# Patient Record
Sex: Male | Born: 1935 | Race: White | Hispanic: No | Marital: Married | Smoking: Former smoker
Health system: Southern US, Community
[De-identification: ages and names within clinical notes are randomized; demographics above are authoritative.]

## PROBLEM LIST (undated history)

## (undated) DIAGNOSIS — R0602 Shortness of breath: Secondary | ICD-10-CM

## (undated) DIAGNOSIS — Z860100 Personal history of colon polyps, unspecified: Secondary | ICD-10-CM

## (undated) DIAGNOSIS — B019 Varicella without complication: Secondary | ICD-10-CM

## (undated) DIAGNOSIS — I10 Essential (primary) hypertension: Secondary | ICD-10-CM

## (undated) DIAGNOSIS — C3490 Malignant neoplasm of unspecified part of unspecified bronchus or lung: Secondary | ICD-10-CM

## (undated) DIAGNOSIS — Z8709 Personal history of other diseases of the respiratory system: Secondary | ICD-10-CM

## (undated) HISTORY — PX: TONSILLECTOMY: SUR1361

## (undated) HISTORY — PX: OTHER SURGICAL HISTORY: SHX169

## (undated) HISTORY — DX: Varicella without complication: B01.9

## (undated) HISTORY — PX: UMBILICAL HERNIA REPAIR: SHX196

## (undated) HISTORY — PX: CIRCUMCISION: SUR203

## (undated) HISTORY — DX: Malignant neoplasm of unspecified part of unspecified bronchus or lung: C34.90

## (undated) HISTORY — PX: COLONOSCOPY: SHX174

## (undated) HISTORY — PX: WISDOM TOOTH EXTRACTION: SHX21

---

## 2013-10-02 ENCOUNTER — Other Ambulatory Visit (HOSPITAL_BASED_OUTPATIENT_CLINIC_OR_DEPARTMENT_OTHER): Payer: Self-pay | Admitting: Physician Assistant

## 2013-10-02 DIAGNOSIS — R9389 Abnormal findings on diagnostic imaging of other specified body structures: Secondary | ICD-10-CM

## 2013-10-03 ENCOUNTER — Ambulatory Visit (HOSPITAL_BASED_OUTPATIENT_CLINIC_OR_DEPARTMENT_OTHER)
Admission: RE | Admit: 2013-10-03 | Discharge: 2013-10-03 | Disposition: A | Discharge status: Home / Self Care | Payer: Medicare Other | Source: Ambulatory Visit | Attending: Physician Assistant | Admitting: Physician Assistant

## 2013-10-03 DIAGNOSIS — I2584 Coronary atherosclerosis due to calcified coronary lesion: Secondary | ICD-10-CM | POA: Insufficient documentation

## 2013-10-03 DIAGNOSIS — R05 Cough: Secondary | ICD-10-CM | POA: Insufficient documentation

## 2013-10-03 DIAGNOSIS — R5383 Other fatigue: Secondary | ICD-10-CM

## 2013-10-03 DIAGNOSIS — R911 Solitary pulmonary nodule: Secondary | ICD-10-CM | POA: Insufficient documentation

## 2013-10-03 DIAGNOSIS — R5381 Other malaise: Secondary | ICD-10-CM | POA: Insufficient documentation

## 2013-10-03 DIAGNOSIS — R059 Cough, unspecified: Secondary | ICD-10-CM | POA: Insufficient documentation

## 2013-10-03 DIAGNOSIS — Z87891 Personal history of nicotine dependence: Secondary | ICD-10-CM | POA: Insufficient documentation

## 2013-10-03 DIAGNOSIS — R9389 Abnormal findings on diagnostic imaging of other specified body structures: Secondary | ICD-10-CM

## 2013-10-11 ENCOUNTER — Emergency Department (HOSPITAL_BASED_OUTPATIENT_CLINIC_OR_DEPARTMENT_OTHER)
Admission: EM | Admit: 2013-10-11 | Discharge: 2013-10-11 | Disposition: A | Discharge status: Home / Self Care | Payer: Medicare Other | Attending: Emergency Medicine | Admitting: Emergency Medicine

## 2013-10-11 ENCOUNTER — Emergency Department (HOSPITAL_BASED_OUTPATIENT_CLINIC_OR_DEPARTMENT_OTHER): Payer: Medicare Other

## 2013-10-11 ENCOUNTER — Encounter (HOSPITAL_BASED_OUTPATIENT_CLINIC_OR_DEPARTMENT_OTHER): Payer: Self-pay | Admitting: Emergency Medicine

## 2013-10-11 DIAGNOSIS — J40 Bronchitis, not specified as acute or chronic: Secondary | ICD-10-CM

## 2013-10-11 DIAGNOSIS — J209 Acute bronchitis, unspecified: Secondary | ICD-10-CM | POA: Insufficient documentation

## 2013-10-11 DIAGNOSIS — Z7982 Long term (current) use of aspirin: Secondary | ICD-10-CM | POA: Insufficient documentation

## 2013-10-11 DIAGNOSIS — Z79899 Other long term (current) drug therapy: Secondary | ICD-10-CM | POA: Insufficient documentation

## 2013-10-11 DIAGNOSIS — Z91040 Latex allergy status: Secondary | ICD-10-CM | POA: Insufficient documentation

## 2013-10-11 DIAGNOSIS — I10 Essential (primary) hypertension: Secondary | ICD-10-CM | POA: Insufficient documentation

## 2013-10-11 HISTORY — DX: Essential (primary) hypertension: I10

## 2013-10-11 MED ORDER — IPRATROPIUM BROMIDE 0.02 % IN SOLN
0.5000 mg | Freq: Once | RESPIRATORY_TRACT | Status: AC
  Administered 2013-10-11: 0.5 mg via RESPIRATORY_TRACT
  Filled 2013-10-11: qty 2.5

## 2013-10-11 MED ORDER — PREDNISONE 50 MG PO TABS: 50.0000 mg | ORAL_TABLET | Freq: Every day | ORAL | Status: DC

## 2013-10-11 MED ORDER — LEVOFLOXACIN 750 MG PO TABS
750.0000 mg | ORAL_TABLET | Freq: Once | ORAL | Status: AC
  Administered 2013-10-11: 750 mg via ORAL
  Filled 2013-10-11: qty 1

## 2013-10-11 MED ORDER — AEROCHAMBER PLUS FLO-VU MEDIUM MISC
1.0000 | Freq: Once | Status: AC
  Administered 2013-10-11: 1
  Filled 2013-10-11: qty 1

## 2013-10-11 MED ORDER — MOXIFLOXACIN HCL 400 MG PO TABS: 400.0000 mg | ORAL_TABLET | Freq: Every day | ORAL | Status: DC

## 2013-10-11 MED ORDER — ALBUTEROL SULFATE HFA 108 (90 BASE) MCG/ACT IN AERS
2.0000 | INHALATION_SPRAY | RESPIRATORY_TRACT | Status: DC | PRN
  Administered 2013-10-11: 2 via RESPIRATORY_TRACT
  Filled 2013-10-11: qty 6.7

## 2013-10-11 MED ORDER — ALBUTEROL SULFATE (2.5 MG/3ML) 0.083% IN NEBU
5.0000 mg | INHALATION_SOLUTION | Freq: Once | RESPIRATORY_TRACT | Status: AC
  Administered 2013-10-11: 5 mg via RESPIRATORY_TRACT
  Filled 2013-10-11: qty 6

## 2013-10-11 MED ORDER — PREDNISONE 50 MG PO TABS
60.0000 mg | ORAL_TABLET | Freq: Once | ORAL | Status: AC
  Administered 2013-10-11: 60 mg via ORAL
  Filled 2013-10-11 (×2): qty 1

## 2013-10-11 NOTE — ED Provider Notes (Signed)
CSN: [id number]     Arrival date & time 10/11/13  1101 History   First MD Initiated Contact with Patient 10/11/13 1155     Chief Complaint  Patient presents with  . Cough     (Consider location/radiation/quality/duration/timing/severity/associated sxs/prior Treatment) HPI Pt presents with c/o cough and congestion.  Pt states he had cough and congestion.  Was treated with steroids and had some improvement but was never totally better.  Yesterday he feels his cough worsened and sputum changed from clear to yellow/green. No fever, no chest pain.  No difficulty breathing.  No leg swelling. He has continued to drink liquids normally. There are no other associated systemic symptoms, there are no other alleviating or modifying factors.   Past Medical History  Diagnosis Date  . Hypertension    History reviewed. No pertinent past surgical history. No family history on file. History  Substance Use Topics  . Smoking status: Never Smoker   . Smokeless tobacco: Not on file  . Alcohol Use: Yes     Comment: occassional    Review of Systems ROS reviewed and all otherwise negative except for mentioned in HPI    Allergies  Latex  Home Medications   Current Outpatient Rx  Name  Route  Sig  Dispense  Refill  . aspirin 81 MG tablet   Oral   Take 81 mg by mouth daily.         Marland Kitchen losartan (COZAAR) 50 MG tablet   Oral   Take 50 mg by mouth daily.         Marland Kitchen moxifloxacin (AVELOX) 400 MG tablet   Oral   Take 1 tablet (400 mg total) by mouth daily at 8 pm.   10 tablet   0   . predniSONE (DELTASONE) 50 MG tablet   Oral   Take 1 tablet (50 mg total) by mouth daily.   4 tablet   0    BP 143/64  Pulse 99  Temp(Src) 99.9 F (37.7 C) (Oral)  Resp 22  Ht 5\' 8"  (1.727 m)  Wt 182 lb (82.555 kg)  BMI 27.68 kg/m2  SpO2 94% Vitals reviewed Physical Exam Physical Examination: General appearance - alert, well appearing, and in no distress Mental status - alert, oriented to person,  place, and time Eyes - no conjunctival injection, no scleral icterus Mouth - mucous membranes moist, pharynx normal without lesions Neck - supple, no significant adenopathy Chest - BSS, faint expiratory wheezes, rales or rhonchi, symmetric air entry, respiratory effort normal Heart - normal rate, regular rhythm, normal S1, S2, no murmurs, rubs, clicks or gallops Abdomen - soft, nontender, nondistended, no masses or organomegaly Extremities - peripheral pulses normal, no pedal edema, no clubbing or cyanosis Skin - normal coloration and turgor, no rashes  ED Course  Procedures (including critical care time) Labs Review Labs Reviewed - No data to display Imaging Review Dg Chest 2 View  10/11/2013   CLINICAL DATA:  Recent diagnosis of bronchitis, persistent cough  EXAM: CHEST  2 VIEW  COMPARISON:  CT CHEST W/O CM dated 10/03/2013  FINDINGS: Grossly unchanged cardiac silhouette and mediastinal contours with mild tortuosity of the thoracic aorta. The lungs remain hyperexpanded with flattening of the bilateral hemidiaphragms. Minimal bibasilar heterogeneous opacities favored to represent atelectasis or scar. Grossly unchanged appearance of known approximately 1.8 cm spiculated nodule within the left upper lobe. No new focal airspace opacities. No pleural effusion or pneumothorax. No evidence of edema. Unchanged bones.  IMPRESSION: 1. Hyperexpanded lungs  and bibasilar atelectasis without acute cardiopulmonary disease. 2. Grossly unchanged appearance of approximately 1.8 cm spiculated nodule within the left upper lobe again worrisome for primary bronchogenic carcinoma as demonstrated on recently performed chest CT. Further evaluation with PET scan may be performed as clinically indicated.   Electronically Signed   By: Sandi Mariscal M.D.   On: 10/11/2013 11:36    EKG Interpretation   None       MDM   Final diagnoses:  Bronchitis    Pt presenting with cough and congestion.  Mild wheezing on exam that  cleared after one neb in the ED. No signs of pneumonia on exam.  Will treat with steroids, abx, and patient given inhaler for home use.  Pt has no change in prior nodule found originally on chest CT recently.  He has appointment already scheduled with his doctor next week.  Discharged with strict return precautions.  Pt agreeable with plan.    Threasa Beards, MD 10/12/13 [phone]

## 2013-10-11 NOTE — Discharge Instructions (Signed)
Return to the ED with any concerns including difficulty breathing despite using albuterol every 4 hours, not drinking fluids, decreased urine output, vomiting and not able to keep down liquids or medications, decreased level of alertness/lethargy, or any other alarming symptoms

## 2013-10-11 NOTE — ED Notes (Addendum)
Patient was treated last week for bronchitis. States that he did get better but now he is sick again. Patient was given prednisone at that time.

## 2013-10-15 ENCOUNTER — Telehealth: Payer: Self-pay | Admitting: Hematology & Oncology

## 2013-10-15 NOTE — Telephone Encounter (Signed)
Spoke w NEW PATIENT today to remind them of their appointment with Dr. Ennever. Also, advised them to bring all meds and insurance information. ° °

## 2013-10-16 ENCOUNTER — Encounter: Payer: Self-pay | Admitting: Physician Assistant

## 2013-10-16 ENCOUNTER — Ambulatory Visit (HOSPITAL_BASED_OUTPATIENT_CLINIC_OR_DEPARTMENT_OTHER): Payer: Medicare Other | Admitting: Hematology & Oncology

## 2013-10-16 ENCOUNTER — Ambulatory Visit: Payer: Medicare Other

## 2013-10-16 ENCOUNTER — Telehealth: Payer: Self-pay | Admitting: Hematology & Oncology

## 2013-10-16 ENCOUNTER — Ambulatory Visit (INDEPENDENT_AMBULATORY_CARE_PROVIDER_SITE_OTHER): Payer: Medicare Other | Admitting: Physician Assistant

## 2013-10-16 ENCOUNTER — Encounter: Payer: Self-pay | Admitting: Hematology & Oncology

## 2013-10-16 ENCOUNTER — Other Ambulatory Visit: Payer: Medicare Other | Admitting: Lab

## 2013-10-16 VITALS — BP 126/78 | HR 93 | Temp 98.2°F | Resp 16 | Ht 68.0 in | Wt 183.0 lb

## 2013-10-16 VITALS — BP 140/81 | HR 64 | Temp 97.9°F | Resp 18 | Ht 68.0 in | Wt 182.0 lb

## 2013-10-16 DIAGNOSIS — C3490 Malignant neoplasm of unspecified part of unspecified bronchus or lung: Secondary | ICD-10-CM

## 2013-10-16 DIAGNOSIS — I10 Essential (primary) hypertension: Secondary | ICD-10-CM

## 2013-10-16 DIAGNOSIS — Z87891 Personal history of nicotine dependence: Secondary | ICD-10-CM

## 2013-10-16 DIAGNOSIS — R911 Solitary pulmonary nodule: Secondary | ICD-10-CM

## 2013-10-16 DIAGNOSIS — J209 Acute bronchitis, unspecified: Secondary | ICD-10-CM

## 2013-10-16 DIAGNOSIS — Z7689 Persons encountering health services in other specified circumstances: Secondary | ICD-10-CM

## 2013-10-16 DIAGNOSIS — Z7189 Other specified counseling: Secondary | ICD-10-CM

## 2013-10-16 NOTE — Progress Notes (Signed)
Pre visit review using our clinic review tool, if applicable. No additional management support is needed unless otherwise documented below in the visit note/SLS  

## 2013-10-16 NOTE — Patient Instructions (Signed)
It was a pleasure meeting you and your wife today. I am also happy to participate in your care.  I will need to obtain your medical records from your previous PCP.  Dr. Marin Olp will need these as well.  Since you have already had your physical in the past few months, I will hold off on lab work until I review your previous records.  I will also schedule follow-up based on any lab abnormalities in your records.  Continue your antibiotic as prescribed until all tablets are gone.  Increase fluid intake.  Rest.  Humidifier in bedroom.  Plain Mucinex.

## 2013-10-16 NOTE — Progress Notes (Signed)
Patient presents to clinic today to establish care.  Acute Concerns: Patient recently seen in the ED for acute bronchitis on 10/11/13.  Patient with recent diagnosis of bronchogenic carcinoma diagnosed via CT.  Patient is a former smoker.  Patient was given RX for prednisone taper and for Avelox.  Patient is still taking antibiotic but has completed steroids.  Patient denies fever, chills, malaise/fatigue.  Denies cough, shortness of breath or wheezing.     Chronic Issues: Bronchogenic Carcinoma -- new diagnosis. Patient has appointment to establish care with Dr. Marin Olp (Oncology) later today.  Hypertension -- long-standing diagnosis.  Patient denies headache, vision changes, chest pain, palpitations, shortness of breath, lightheadedness or dizziness.  BP 126/78 in clinic today.  Patient takes losartan daily.  Health Maintenance: Immunizations -- UTD Colonoscopy -- 2011; no abnormalities; no need for repeat due to age  Past Medical History  Diagnosis Date  . Hypertension   . Chicken pox   . BC (bronchogenic carcinoma)     Past Surgical History  Procedure Laterality Date  . Umbilical hernia repair    . Tonsillectomy    . Wisdom tooth extraction      Current Outpatient Prescriptions on File Prior to Visit  Medication Sig Dispense Refill  . aspirin 81 MG tablet Take 81 mg by mouth daily.      Marland Kitchen losartan (COZAAR) 50 MG tablet Take 50 mg by mouth daily.      Marland Kitchen moxifloxacin (AVELOX) 400 MG tablet Take 1 tablet (400 mg total) by mouth daily at 8 pm.  10 tablet  0   No current facility-administered medications on file prior to visit.    Allergies  Allergen Reactions  . Adhesive [Tape]     Skin Irritation  . Latex     Family History  Problem Relation Age of Onset  . Hypertension Father     Deceased  . Stroke Father 29  . Ulcers Father   . Alcoholism Father   . Hyperlipidemia Father   . Glaucoma Mother 69    Deceased  . Dementia Sister   . Breast cancer Sister   .  Healthy Brother   . Breast cancer Daughter   . Thyroid cancer Daughter     History   Social History  . Marital Status: Married    Spouse Name: N/A    Number of Children: N/A  . Years of Education: N/A   Occupational History  . Not on file.   Social History Main Topics  . Smoking status: Former Smoker -- 2.00 packs/day for 12 years    Types: Cigarettes    Start date: 12/15/1955    Quit date: 10/16/1968  . Smokeless tobacco: Never Used     Comment: quit 45 years ago  . Alcohol Use: 4.2 oz/week    7 Glasses of wine per week  . Drug Use: No  . Sexual Activity: Not on file   Other Topics Concern  . Not on file   Social History Narrative  . No narrative on file    Review of Systems  Constitutional: Negative for fever and malaise/fatigue.  HENT: Negative for ear discharge, ear pain, hearing loss and tinnitus.   Eyes: Positive for blurred vision. Negative for double vision, photophobia and pain.       Chronic blurred vision -- followed by Ret[facility] Specialist.  Respiratory: Positive for cough and sputum production. Negative for hemoptysis, shortness of breath and wheezing.   Cardiovascular: Negative for chest pain and palpitations.  Gastrointestinal: Negative  for heartburn, nausea, vomiting, abdominal pain, diarrhea, constipation, blood in stool and melena.  Genitourinary: Negative for dysuria, urgency, frequency, hematuria and flank pain.  Neurological: Negative for dizziness, seizures, loss of consciousness and headaches.  Endo/Heme/Allergies: Negative for environmental allergies.  Psychiatric/Behavioral: Negative for depression, suicidal ideas, hallucinations and substance abuse. The patient is not nervous/anxious and does not have insomnia.    BP 126/78  Pulse 93  Temp(Src) 98.2 F (36.8 C) (Oral)  Resp 16  Ht 5\' 8"  (1.727 m)  Wt 183 lb (83.008 kg)  BMI 27.83 kg/m2  SpO2 98%  Physical Exam  Vitals reviewed. Constitutional: He is oriented to person, place, and time  and well-developed, well-nourished, and in no distress.  HENT:  Head: Normocephalic and atraumatic.  Right Ear: External ear normal.  Left Ear: External ear normal.  Nose: Nose normal.  Mouth/Throat: Oropharynx is clear and moist. No oropharyngeal exudate.  TM within normal limits bilaterally.  Eyes: Conjunctivae and EOM are normal. Pupils are equal, round, and reactive to light.  Neck: Neck supple.  Cardiovascular: Normal rate, regular rhythm, normal heart sounds and intact distal pulses.   Pulmonary/Chest: Effort normal and breath sounds normal. No respiratory distress. He has no wheezes. He has no rales. He exhibits no tenderness.  Abdominal: Soft. Bowel sounds are normal. He exhibits no distension and no mass. There is no tenderness. There is no rebound and no guarding.  Lymphadenopathy:    He has no cervical adenopathy.  Neurological: He is alert and oriented to person, place, and time. No cranial nerve deficit.  Skin: Skin is warm and dry. No rash noted.  Psychiatric: Affect normal.    No results found for this or any previous visit (from the past 2160 hour(s)).  Assessment/Plan: Essential hypertension, benign Asymptomatic.  BP normotensive in clinic.  Continue current regimen.  BC (bronchogenic carcinoma) Diagnosed via CT in ER.  Patient is former smoker with asbestos exposure.  Has appointment with Dr. Marin Olp today for further evaluation and treatment.  Acute bronchitis Physical exam within normal limits.  Patient feeling better.  Finish course of antibiotic.  Call or return if symptoms recur.  Encounter to establish care Medical history reviewed and updated. Recent ER visit reviewed.  Patient endorses having his Medicare Wellness ~ 2 months ago.  Will obtain records from previous PCP.

## 2013-10-16 NOTE — Telephone Encounter (Signed)
Called Central Scheduling and sch Pet Scan with Nicole Kindred for 10/24/13 at [facility].  Patient was given apt date, time, location, and instructions

## 2013-10-16 NOTE — Progress Notes (Signed)
Referral MD  Reason for Referral: left upper lung nodule  HPI: [name] is a very nice 80 year old white gentleman. He's been in very good health. 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 He and his wife now enjoying traveling and sailing.Marland Kitchen  He recently had a cough.this was nonproductive. There is no chest pain. He had no hemoptysis. There is no fever.  He had no weight loss or weight gain. He had no change in bowel bladder habits. There is no headache.  He went to his family doctor. Heactually saw Adam [name], next-door. Adam [name] order a chest x-ray in. This showed a lesion in the left upper lung. This measured 1.8 cm.  A CT scan was then done. This confirmed a 1.5 x 1.6 cm nodule in the right upper lung. There is some scattered nodules our 3 mm or less. There is a low attenuation lesion lateral to the left pulmonary artery measuring 2.2 x 2.5 cm. Since contrast was not given, and it's unknown what this represents. No disease was noted elsewhere.  Adam [name] was, referred to the [facility] for evaluation.  Of note, he does have some asbestos exposure. He has not smoked for 40 years. He smoked maybe about 10 years.  He is very fit. He has a good performance status.  Chief Complaint  Patient presents with  . NEW PATIENT    Past Medical History  Diagnosis Date  . Hypertension   . Chicken pox   . BC (bronchogenic carcinoma)   :  Past Surgical History  Procedure Laterality Date  . Umbilical hernia repair    . Tonsillectomy    . Wisdom tooth extraction    :  Current outpatient prescriptions:aspirin 81 MG tablet, Take 81 mg by mouth daily., Disp: , Rfl: ;  CALCIUM-VITAMIN D PO, Take by mouth daily., Disp: , Rfl: ;  Cyanocobalamin (VITAMIN B-12) 1000 MCG SUBL, Place under the tongue daily., Disp: , Rfl: ;  furosemide (LASIX) 20 MG tablet, Take 20 mg by mouth daily., Disp: , Rfl: ;  losartan (COZAAR) 50 MG tablet, Take 50 mg by mouth daily., Disp: , Rfl:   magnesium 30 MG tablet, Take 30 mg by mouth daily., Disp: , Rfl: ;  moxifloxacin (AVELOX) 400 MG tablet, Take 1 tablet (400 mg total) by mouth daily at 8 pm., Disp: 10 tablet, Rfl: 0;  omega-3 fish oil (MAXEPA) 1000 MG CAPS capsule, Take by mouth daily., Disp: , Rfl: :  :  Allergies  Allergen Reactions  . Adhesive [Tape]     Skin Irritation  . Latex   :  Family History  Problem Relation Age of Onset  . Hypertension Father     Deceased  . Stroke Father 70  . Ulcers Father   . Alcoholism Father   . Hyperlipidemia Father   . Glaucoma Mother 36    Deceased  . Dementia Sister   . Breast cancer Sister   . Healthy Brother   . Breast cancer Daughter   . Thyroid cancer Daughter   :  History   Social History  . Marital Status: Married    Spouse Name: N/A    Number of Children: N/A  . Years of Education: N/A   Occupational History  . Not on file.   Social History Main Topics  . Smoking status: Former Smoker -- 2.00 packs/day for 12 years    Types: Cigarettes    Start date: 12/15/1955    Quit  date: 10/16/1968  . Smokeless tobacco: Never Used     Comment: quit 45 years ago  . Alcohol Use: 4.2 oz/week    7 Glasses of wine per week  . Drug Use: No  . Sexual Activity: Not on file   Other Topics Concern  . Not on file   Social History Narrative  . No narrative on file  :  Pertinent items are noted in HPI.  Exam: @[facility] @ Well-developed well-nourished. Lungs are clear bilaterally. No wheezes are noted. Cardiac exam regular rate and rhythm. There are no murmurs rubs or bruits. Neck surgeon is no adenopathy. Abdomen is soft. Has good bowel sounds. There is no palpable liver or spleen. Back exam no tenderness. Extremities no clubbing cyanosis or edema. Skin exam no unusual rashes. There's some hyperpigmented lesions. He may have some keratosis. Neurological exam is nonfocal.  No results found for this basename: WBC, HGB, HCT, PLT,  in the last 72 hours No results  found for this basename: NA, K, CL, CO2, GLUCOSE, BUN, CREATININE, CALCIUM,  in the last 72 hours  Blood smear review: none  Pathology:none    Assessment and Plan: 80 year old gentleman. He is in excellent health. He has a left upper lung nodule. This is very suspicious for bronchogenic carcinoma. He did has had a smoking history. He has had asbestos exposure.  I highly confident that this is a primary lung cancer.  We will get a PET scan on him. I did a PET scan we'll then dictate what will be done. If the PET scan is positive with no disease noted elsewhere or in the mediastinum, and then I would refer Adam [name] right to surgery for a wedge resection or lobectomy.  I just don't think that a biopsy is mandatory at this point,. Of course, if the PET scan is equivocal, that we might have to do a biopsy. If he has disease elsewhere, then we may have to do a biopsy.  [name] They understand the situation.  I don't see that we need to get any lab work on him right now. I just don't think that this would really make a difference.  Again, [name]  As noted hour with Adam [name] and his wife. I explained my recommendations. They have a very good understanding.  I'll be in touch with him once I get the results back and then we will decide what the next step is. I do think that it will likely be surgical referral.

## 2013-10-19 DIAGNOSIS — Z7689 Persons encountering health services in other specified circumstances: Secondary | ICD-10-CM | POA: Insufficient documentation

## 2013-10-19 DIAGNOSIS — J209 Acute bronchitis, unspecified: Secondary | ICD-10-CM | POA: Insufficient documentation

## 2013-10-19 DIAGNOSIS — I10 Essential (primary) hypertension: Secondary | ICD-10-CM | POA: Insufficient documentation

## 2013-10-19 DIAGNOSIS — C3490 Malignant neoplasm of unspecified part of unspecified bronchus or lung: Secondary | ICD-10-CM | POA: Insufficient documentation

## 2013-10-19 NOTE — Assessment & Plan Note (Signed)
Diagnosed via CT in ER.  Patient is former smoker with asbestos exposure.  Has appointment with Dr. Marin Olp today for further evaluation and treatment.

## 2013-10-19 NOTE — Assessment & Plan Note (Signed)
Medical history reviewed and updated. Recent ER visit reviewed.  Patient endorses having his Medicare Wellness ~ 2 months ago.  Will obtain records from previous PCP.

## 2013-10-19 NOTE — Assessment & Plan Note (Signed)
Physical exam within normal limits.  Patient feeling better.  Finish course of antibiotic.  Call or return if symptoms recur.

## 2013-10-19 NOTE — Assessment & Plan Note (Signed)
Asymptomatic.  BP normotensive in clinic.  Continue current regimen.

## 2013-10-24 ENCOUNTER — Encounter (HOSPITAL_COMMUNITY)
Admission: RE | Admit: 2013-10-24 | Discharge: 2013-10-24 | Disposition: A | Discharge status: Home / Self Care | Payer: Medicare Other | Source: Ambulatory Visit | Attending: Hematology & Oncology | Admitting: Hematology & Oncology

## 2013-10-24 DIAGNOSIS — J9819 Other pulmonary collapse: Secondary | ICD-10-CM | POA: Insufficient documentation

## 2013-10-24 DIAGNOSIS — R911 Solitary pulmonary nodule: Secondary | ICD-10-CM | POA: Diagnosis not present

## 2013-10-24 DIAGNOSIS — C3490 Malignant neoplasm of unspecified part of unspecified bronchus or lung: Secondary | ICD-10-CM | POA: Insufficient documentation

## 2013-10-24 LAB — GLUCOSE, CAPILLARY: Glucose-Capillary: 117 mg/dL — ABNORMAL HIGH (ref 70–99)

## 2013-10-24 MED ORDER — FLUDEOXYGLUCOSE F - 18 (FDG) INJECTION
9.7000 | Freq: Once | INTRAVENOUS | Status: AC | PRN
  Administered 2013-10-24: 9.7 via INTRAVENOUS

## 2013-10-28 NOTE — Addendum Note (Signed)
Addended by: Burney Gauze R on: 10/28/2013 11:54 AM   Modules accepted: Orders

## 2013-11-04 ENCOUNTER — Encounter: Payer: Self-pay | Admitting: Thoracic Surgery (Cardiothoracic Vascular Surgery)

## 2013-11-04 ENCOUNTER — Institutional Professional Consult (permissible substitution) (INDEPENDENT_AMBULATORY_CARE_PROVIDER_SITE_OTHER): Payer: Medicare Other | Admitting: Thoracic Surgery (Cardiothoracic Vascular Surgery)

## 2013-11-04 ENCOUNTER — Other Ambulatory Visit: Payer: Self-pay | Admitting: *Deleted

## 2013-11-04 ENCOUNTER — Other Ambulatory Visit: Payer: Self-pay

## 2013-11-04 VITALS — BP 150/82 | HR 70 | Resp 20 | Ht 68.0 in | Wt 182.0 lb

## 2013-11-04 DIAGNOSIS — D381 Neoplasm of uncertain behavior of trachea, bronchus and lung: Secondary | ICD-10-CM

## 2013-11-04 DIAGNOSIS — R918 Other nonspecific abnormal finding of lung field: Secondary | ICD-10-CM

## 2013-11-04 DIAGNOSIS — R222 Localized swelling, mass and lump, trunk: Secondary | ICD-10-CM

## 2013-11-04 NOTE — Progress Notes (Signed)
PCP is Leeanne Rio, PA-C Referring Provider is Marin Olp Rudell Cobb, MD  Chief Complaint  Patient presents with  . Lung Mass    Surgical eval on left upper lobe lung mass, PET Scan 10/24/13, Chest CT 10/03/13    HPI: Adam [name] is an 80 year old gentleman sent for consultation by Dr. and over regarding a left upper lobe mass.  Adam [name] is an 80 year old gentleman with a remote history of tobacco abuse and asbestos exposure. He smoked 2 packs a day for about 10 years but quit 45 years ago. He also had some exposure to asbestos. He worked in [employer].  He recently moved to [place] from [facility]. He had a productive cough about 2-3 weeks ago. He went to an urgent care to get that checked out. He was treated with antibiotics for bronchitis. They did recommend a chest x-ray. It showed a left upper lobe opacity. A CT of the chest was done which showed a 1.6 cm spiculated left upper lobe mass. He was referred to Dr. Burney Gauze. He did a PET/CT which showed the lesion was hypermetabolic with an SUV of 11.  He says he's lost a few pounds, 3 over the past 3 months and 5 over the past 6 months. He says he does have decreased energy which he attributes to "old age." He does not have any chest pain, pressure, or tightness. He remains very physically active. He says that he would get short of breath if he ran 50 yards. But he can walk half a mile without shortness of breath. He also can get up 2 flights of stairs without stopping. He has not had any hemoptysis. He denies wheezing.  ECOG/ZUBROD= 0   Past Medical History  Diagnosis Date  . Hypertension   . Chicken pox   . BC (bronchogenic carcinoma)     Past Surgical History  Procedure Laterality Date  . Umbilical hernia repair    . Tonsillectomy    . Wisdom tooth extraction      Family History  Problem Relation Age of Onset  . Hypertension Father     Deceased  . Stroke Father 44  . Ulcers Father    . Alcoholism Father   . Hyperlipidemia Father   . Glaucoma Mother 44    Deceased  . Dementia Sister   . Breast cancer Sister   . Healthy Brother   . Breast cancer Daughter   . Thyroid cancer Daughter     Social History History  Substance Use Topics  . Smoking status: Former Smoker -- 2.00 packs/day for 12 years    Types: Cigarettes    Start date: 12/15/1955    Quit date: 10/16/1968  . Smokeless tobacco: Never Used     Comment: quit 45 years ago  . Alcohol Use: 4.2 oz/week    7 Glasses of wine per week    Current Outpatient Prescriptions  Medication Sig Dispense Refill  . aspirin 81 MG tablet Take 81 mg by mouth daily.      Marland Kitchen CALCIUM-VITAMIN D PO Take by mouth daily.      . Cyanocobalamin (VITAMIN B-12) 1000 MCG SUBL Place under the tongue daily.      Marland Kitchen losartan (COZAAR) 50 MG tablet Take 50 mg by mouth daily.      . magnesium 30 MG tablet Take 30 mg by mouth daily.      Marland Kitchen omega-3 fish oil (MAXEPA) 1000 MG CAPS capsule Take by mouth daily.  No current facility-administered medications for this visit.    Allergies  Allergen Reactions  . Adhesive [Tape]     Skin Irritation  . Latex     Review of Systems  Constitutional: Positive for unexpected weight change (lost 3 pounds in 3 months, 5 pounds in 6 months).       Decreased energy  Eyes: Positive for visual disturbance.  Respiratory: Positive for cough and shortness of breath (with heavy exertion). Negative for wheezing and stridor.        Recent bronchitis  Cardiovascular: Negative for chest pain and leg swelling.  Skin: Positive for rash.  All other systems reviewed and are negative.    BP 150/82  Pulse 70  Resp 20  Ht 5\' 8"  (1.727 m)  Wt 182 lb (82.555 kg)  BMI 27.68 kg/m2  SpO2 98% Physical Exam  Vitals reviewed. Constitutional: He is oriented to person, place, and time. He appears well-developed and well-nourished. No distress.  HENT:  Head: Normocephalic and atraumatic.  Eyes: EOM are  normal. Pupils are equal, round, and reactive to light.  Neck: Neck supple. No thyromegaly present.  Cardiovascular: Normal rate, regular rhythm, normal heart sounds and intact distal pulses.  Exam reveals no gallop and no friction rub.   No murmur heard. Pulmonary/Chest: Effort normal and breath sounds normal. He has no wheezes. He has no rales.  Abdominal: Soft. There is no tenderness.  Musculoskeletal: He exhibits no edema.  Lymphadenopathy:    He has no cervical adenopathy.  Neurological: He is alert and oriented to person, place, and time. No cranial nerve deficit.  No focal motor deficit     Diagnostic Tests:  CT CHEST WITHOUT CONTRAST 10/03/2013 TECHNIQUE:  Multidetector CT imaging of the chest was performed following the  standard protocol without IV contrast.  COMPARISON: No available priors.  FINDINGS:  There are scattered unenlarged lymph nodes in the mediastinum. A  rounded ovoid low-attenuation structure lateral to the left  pulmonary artery measures 2.2 x 2.5 cm and 8 Hounsfield units. Hilar  regions are difficult to definitively evaluate without IV contrast.  No axillary adenopathy. Ascending aorta measures approximately 4.1  cm. Coronary artery calcification. Heart is mildly enlarged.  Lipomatous hypertrophy of the interatrial septum is incidentally  noted. No pericardial effusion.  Spiculated nodule in the left upper lobe measures 1.5 x 1.6 cm.  Additional scattered tiny pulmonary nodules measure 3 mm or less in  size. Mild basilar predominant subpleural reticulation. There may be  a calcified granuloma in the right lower lobe. No pleural fluid.  Minimal debris in the trachea.  Incidental imaging of the upper abdomen shows the visualized  portions of the liver, gallbladder, adrenal glands, kidneys, spleen,  pancreas, stomach and bowel to the grossly unremarkable. No upper  abdominal adenopathy. No worrisome lytic or sclerotic lesions.  Degenerative changes are  seen in the spine.  IMPRESSION:  1. Spiculated left upper lobe nodule is highly worrisome for primary  bronchogenic carcinoma. These results will be called to the ordering  clinician or representative by the Radiologist Assistant, and  communication documented in the PACS Dashboard.  2. Low-density structure along the left pulmonary artery may be  pericardial in origin. Continued attention on followup exams is  warranted.  3. Borderline ascending aortic aneurysm with coronary artery  calcification.  4. Mild bibasilar subpleural fibrosis.  Electronically Signed  By: Lorin Picket M.D.  On: 10/03/2013 15:41  PET/CT 10/24/2013 NUCLEAR MEDICINE PET SKULL BASE TO THIGH  FASTING BLOOD GLUCOSE:  Value: 117 mg/dl  TECHNIQUE:  9.7 mCi F-18 FDG was injected intravenously. Full-ring PET imaging  was performed from the skull base to thigh after the radiotracer. CT  data was obtained and used for attenuation correction and anatomic  localization.  COMPARISON: No comparisons  FINDINGS:  NECK  No hypermetabolic lymph nodes in the neck.  CHEST  16 mm x 15 mm spiculated nodule in the left upper lobe is a  hypermetabolic with intense metabolic activity (SUV max 11.0). No  additional of pulmonary nodules. There is mild basilar atelectasis.  No hypermetabolic mediastinal lymph nodes. There is a rounded fluid  density structure anterior to the left main pulmonary artery within  the prevascular space which has no metabolic activity consistent  with a cardiac recess or atrial appendage recess.  ABDOMEN/PELVIS  There is a midl hypermetabolic activity associated with the left  adrenal gland which is less than the liver. There is minimal  nodularity. This is felt to represent benign hyperplasia. No  abnormal activity in the liver. No hypermetabolic abdominal pelvic  lymph nodes.  SKELETON  No focal hypermetabolic activity to suggest skeletal metastasis.  IMPRESSION:  1. Hypermetabolic left upper  lobe pulmonary nodule with no evidence  of mediastinal metastasis or distant metastasis. Staging by FDG PET  imaging T1a N0 M0.  2. Minimally hypermetabolic left adrenal gland is felt to represent  benign hyperplasia.  Electronically Signed  By: Suzy Bouchard M.D.  On: 10/24/2013 13:55   Impression: 80 year old gentleman with some level of tobacco and as best as exposure in the past who has a 1.6 cm spiculated nodule in the left upper lobe. This is markedly hypermetabolic on PET CT with an SUV of 11. This needs to be considered a new primary bronchogenic carcinoma must be proven otherwise. The only way to definitively proven otherwise would be to completely resect the mass.  I discussed the differential diagnosis with Mr. and Adam [name]. Adam [name] has recently had surgery and chemotherapy for a stage IIA non-small cell carcinoma, so they are very familiar with the disease and its workup and treatment.  I did discuss possible treatments including surgery and radiation. We also discussed topical about making the diagnosis and whether it would be worthwhile to do a bronchoscopy prior to surgical resection. In my opinion there really is no reason to do bronchoscopy is the lesion is suspicious enough that a warm resection regardless of the findings at bronchoscopy.  I discussed in detail with Mr. and Adam [name] the proposed procedure, left VATS, possible wedge resection, possible left upper lobectomy. They understand that this lesion could potentially require a lobectomy given its central location. They also understand that a lobectomy would be done if the lesion were cancerous as is the best treatment for that disease. We discussed the general nature of the procedure, the need for general anesthesia, the incisions to be used, expected hospital stay, and the overall recovery. We discussed the indications, risks, benefits, and alternatives. They understand that the risk include, but are not  limited to death, MI, stroke, DVT, PE, bleeding, possible need for transfusion, infection, prolonged air leak, cardiac arrhythmias, as well as the possibility of unforeseeable complications.  Adam [name] wishes to be aggressive and have the lesion resected.   Plan: Pulmonary function testing with room air blood gas  Left VATS, possible wedge resection, possible left upper lobectomy on Monday, March 30.

## 2013-11-10 ENCOUNTER — Encounter (HOSPITAL_COMMUNITY): Payer: Self-pay | Admitting: Pharmacy Technician

## 2013-11-12 ENCOUNTER — Ambulatory Visit (HOSPITAL_COMMUNITY)
Admission: RE | Admit: 2013-11-12 | Discharge: 2013-11-12 | Disposition: A | Discharge status: Home / Self Care | Payer: Medicare Other | Source: Ambulatory Visit | Attending: Thoracic Surgery (Cardiothoracic Vascular Surgery) | Admitting: Thoracic Surgery (Cardiothoracic Vascular Surgery)

## 2013-11-12 DIAGNOSIS — R222 Localized swelling, mass and lump, trunk: Secondary | ICD-10-CM | POA: Insufficient documentation

## 2013-11-12 DIAGNOSIS — D381 Neoplasm of uncertain behavior of trachea, bronchus and lung: Secondary | ICD-10-CM

## 2013-11-12 LAB — BLOOD GAS, ARTERIAL
ACID-BASE EXCESS: 0.1 mmol/L (ref 0.0–2.0)
Bicarbonate: 24.2 mEq/L — ABNORMAL HIGH (ref 20.0–24.0)
Drawn by: 24485
O2 Saturation: 95.6 %
PCO2 ART: 39.5 mmHg (ref 35.0–45.0)
Patient temperature: 98.6
TCO2: 25.4 mmol/L (ref 0–100)
pH, Arterial: 7.405 (ref 7.350–7.450)
pO2, Arterial: 78.9 mmHg — ABNORMAL LOW (ref 80.0–100.0)

## 2013-11-12 LAB — PULMONARY FUNCTION TEST
DL/VA % pred: 94 %
DL/VA: 4.13 ml/min/mmHg/L
DLCO COR: 22.42 ml/min/mmHg
DLCO UNC % PRED: 82 %
DLCO cor % pred: 79 %
DLCO unc: 23.37 ml/min/mmHg
FEF 25-75 PRE: 1.87 L/s
FEF2575-%Pred-Pre: 118 %
FEV1-%Pred-Pre: 121 %
FEV1-Pre: 2.9 L
FEV1FVC-%PRED-PRE: 97 %
FEV6-%PRED-PRE: 129 %
FEV6-Pre: 4.1 L
FEV6FVC-%PRED-PRE: 106 %
FVC-%Pred-Pre: 122 %
FVC-Pre: 4.2 L
PRE FEV1/FVC RATIO: 69 %
Pre FEV6/FVC Ratio: 98 %
RV % pred: 104 %
RV: 2.66 L
TLC % pred: 101 %
TLC: 6.56 L

## 2013-11-19 NOTE — Pre-Procedure Instructions (Signed)
Adam [name]  11/19/2013   Your procedure is scheduled on:  Mon, Mar 30 @ 7:30 AM  Report to [facility]  at 5:30 AM.  Call this number if you have problems the morning of surgery: [phone]   Remember:   Do not eat food or drink liquids after midnight.                Stop taking your Fish Oil. No Goody's,BC's,Aleve,Ibuprofen,or any Herbal Medications      Do not wear jewelry  Do not wear lotions, powders, or colognes.   Men may shave face and neck.  Do not bring valuables to the hospital.  [facility] is not responsible                  for any belongings or valuables.               Contacts, dentures or bridgework may not be worn into surgery.  Leave suitcase in the car. After surgery it may be brought to your room.  For patients admitted to the hospital, discharge time is determined by your                treatment team.               Special Instructions:  [facility] - Preparing for Surgery  Before surgery, you can play an important role.  Because skin is not sterile, your skin needs to be as free of germs as possible.  You can reduce the number of germs on you skin by washing with CHG (chlorahexidine gluconate) soap before surgery.  CHG is an antiseptic cleaner which kills germs and bonds with the skin to continue killing germs even after washing.  Please DO NOT use if you have an allergy to CHG or antibacterial soaps.  If your skin becomes reddened/irritated stop using the CHG and inform your nurse when you arrive at Short Stay.  Do not shave (including legs and underarms) for at least 48 hours prior to the first CHG shower.  You may shave your face.  Please follow these instructions carefully:   1.  Shower with CHG Soap the night before surgery and the                                morning of Surgery.  2.  If you choose to wash your hair, wash your hair first as usual with your       normal shampoo.  3.  After you shampoo, rinse your hair and body  thoroughly to remove the                      Shampoo.  4.  Use CHG as you would any other liquid soap.  You can apply chg directly       to the skin and wash gently with scrungie or a clean washcloth.  5.  Apply the CHG Soap to your body ONLY FROM THE NECK DOWN.        Do not use on open wounds or open sores.  Avoid contact with your eyes,       ears, mouth and genitals (private parts).  Wash genitals (private parts)       with your normal soap.  6.  Wash thoroughly, paying special attention to the area where your surgery  will be performed.  7.  Thoroughly rinse your body with warm water from the neck down.  8.  DO NOT shower/wash with your normal soap after using and rinsing off       the CHG Soap.  9.  Pat yourself dry with a clean towel.            10.  Wear clean pajamas.            11.  Place clean sheets on your bed the night of your first shower and do not        sleep with pets.  Day of Surgery  Do not apply any lotions/deoderants the morning of surgery.  Please wear clean clothes to the hospital/surgery center.     Please read over the following fact sheets that you were given: Pain Booklet, Coughing and Deep Breathing, Blood Transfusion Information, MRSA Information and Surgical Site Infection Prevention

## 2013-11-20 ENCOUNTER — Encounter (HOSPITAL_COMMUNITY): Payer: Self-pay

## 2013-11-20 ENCOUNTER — Encounter (HOSPITAL_COMMUNITY)
Admission: RE | Admit: 2013-11-20 | Discharge: 2013-11-20 | Disposition: A | Discharge status: Home / Self Care | Payer: Medicare Other | Source: Ambulatory Visit | Attending: Thoracic Surgery (Cardiothoracic Vascular Surgery) | Admitting: Thoracic Surgery (Cardiothoracic Vascular Surgery)

## 2013-11-20 VITALS — BP 148/87 | HR 50 | Temp 98.2°F | Resp 20 | Ht 68.0 in | Wt 179.0 lb

## 2013-11-20 DIAGNOSIS — Z01812 Encounter for preprocedural laboratory examination: Secondary | ICD-10-CM | POA: Insufficient documentation

## 2013-11-20 DIAGNOSIS — Z01810 Encounter for preprocedural cardiovascular examination: Secondary | ICD-10-CM | POA: Diagnosis not present

## 2013-11-20 DIAGNOSIS — R918 Other nonspecific abnormal finding of lung field: Secondary | ICD-10-CM

## 2013-11-20 HISTORY — DX: Personal history of other diseases of the respiratory system: Z87.09

## 2013-11-20 HISTORY — DX: Personal history of colon polyps, unspecified: Z86.0100

## 2013-11-20 HISTORY — DX: Shortness of breath: R06.02

## 2013-11-20 HISTORY — DX: Personal history of colonic polyps: Z86.010

## 2013-11-20 LAB — COMPREHENSIVE METABOLIC PANEL
ALT: 21 U/L (ref 0–53)
AST: 24 U/L (ref 0–37)
Albumin: 3.8 g/dL (ref 3.5–5.2)
Alkaline Phosphatase: 68 U/L (ref 39–117)
BUN: 16 mg/dL (ref 6–23)
CALCIUM: 9.4 mg/dL (ref 8.4–10.5)
CO2: 21 meq/L (ref 19–32)
Chloride: 103 mEq/L (ref 96–112)
Creatinine, Ser: 0.72 mg/dL (ref 0.50–1.35)
GFR, EST NON AFRICAN AMERICAN: 85 mL/min — AB (ref >90)
GLUCOSE: 90 mg/dL (ref 70–99)
Potassium: 4.5 mEq/L (ref 3.7–5.3)
Sodium: 139 mEq/L (ref 137–147)
Total Bilirubin: 0.8 mg/dL (ref 0.3–1.2)
Total Protein: 6.8 g/dL (ref 6.0–8.3)

## 2013-11-20 LAB — CBC
HEMATOCRIT: 46.9 % (ref 39.0–52.0)
HEMOGLOBIN: 16.7 g/dL (ref 13.0–17.0)
MCH: 33.5 pg (ref 26.0–34.0)
MCHC: 35.6 g/dL (ref 30.0–36.0)
MCV: 94 fL (ref 78.0–100.0)
Platelets: 150 10*3/uL (ref 150–400)
RBC: 4.99 MIL/uL (ref 4.22–5.81)
RDW: 12.7 % (ref 11.5–15.5)
WBC: 6.3 10*3/uL (ref 4.0–10.5)

## 2013-11-20 LAB — URINALYSIS, ROUTINE W REFLEX MICROSCOPIC
Bilirubin Urine: NEGATIVE
GLUCOSE, UA: NEGATIVE mg/dL
HGB URINE DIPSTICK: NEGATIVE
Ketones, ur: NEGATIVE mg/dL
LEUKOCYTES UA: NEGATIVE
Nitrite: NEGATIVE
PROTEIN: NEGATIVE mg/dL
Specific Gravity, Urine: 1.023 (ref 1.005–1.030)
Urobilinogen, UA: 0.2 mg/dL (ref 0.0–1.0)
pH: 7 (ref 5.0–8.0)

## 2013-11-20 LAB — PROTIME-INR
INR: 1.01 (ref 0.00–1.49)
PROTHROMBIN TIME: 13.1 s (ref 11.6–15.2)

## 2013-11-20 LAB — BLOOD GAS, ARTERIAL
ACID-BASE EXCESS: 1.3 mmol/L (ref 0.0–2.0)
BICARBONATE: 25.1 meq/L — AB (ref 20.0–24.0)
Drawn by: 344381
FIO2: 0.21 %
O2 Saturation: 96.2 %
PATIENT TEMPERATURE: 98.6
TCO2: 26.2 mmol/L (ref 0–100)
pCO2 arterial: 37.7 mmHg (ref 35.0–45.0)
pH, Arterial: 7.437 (ref 7.350–7.450)
pO2, Arterial: 77 mmHg — ABNORMAL LOW (ref 80.0–100.0)

## 2013-11-20 LAB — SURGICAL PCR SCREEN
MRSA, PCR: NEGATIVE
Staphylococcus aureus: NEGATIVE

## 2013-11-20 LAB — ABO/RH: ABO/RH(D): O POS

## 2013-11-20 LAB — APTT: aPTT: 31 seconds (ref 24–37)

## 2013-11-20 NOTE — Progress Notes (Addendum)
Pt doesn't have a cardiologist    Echo report in epic from 2010  Stress test report in epic from 2011  Denies ever having a heart cath  PFT in epic from 11-12-13  Medical Md is [place] Dr.McDonald in [place]

## 2013-11-23 MED ORDER — DEXTROSE 5 % IV SOLN
1.5000 g | INTRAVENOUS | Status: AC
  Administered 2013-11-24: 1.5 g via INTRAVENOUS
  Filled 2013-11-23: qty 1.5

## 2013-11-24 ENCOUNTER — Encounter (HOSPITAL_COMMUNITY): Payer: Self-pay

## 2013-11-24 ENCOUNTER — Inpatient Hospital Stay (HOSPITAL_COMMUNITY): Payer: Medicare Other

## 2013-11-24 ENCOUNTER — Inpatient Hospital Stay (HOSPITAL_COMMUNITY): Payer: Medicare Other | Admitting: Anesthesiology

## 2013-11-24 ENCOUNTER — Encounter (HOSPITAL_COMMUNITY)
Admission: RE | Disposition: A | Discharge status: Skilled Nursing Facility | Payer: Self-pay | Source: Ambulatory Visit | Attending: Thoracic Surgery (Cardiothoracic Vascular Surgery)

## 2013-11-24 ENCOUNTER — Inpatient Hospital Stay (HOSPITAL_COMMUNITY)
Admission: RE | Admit: 2013-11-24 | Discharge: 2013-11-29 | DRG: 164 | Disposition: A | Discharge status: Skilled Nursing Facility | Payer: Medicare Other | Source: Ambulatory Visit | Attending: Thoracic Surgery (Cardiothoracic Vascular Surgery) | Admitting: Thoracic Surgery (Cardiothoracic Vascular Surgery)

## 2013-11-24 ENCOUNTER — Encounter (HOSPITAL_COMMUNITY): Payer: Medicare Other | Admitting: Anesthesiology

## 2013-11-24 DIAGNOSIS — C3490 Malignant neoplasm of unspecified part of unspecified bronchus or lung: Secondary | ICD-10-CM

## 2013-11-24 DIAGNOSIS — J9382 Other air leak: Secondary | ICD-10-CM | POA: Diagnosis not present

## 2013-11-24 DIAGNOSIS — Z77090 Contact with and (suspected) exposure to asbestos: Secondary | ICD-10-CM

## 2013-11-24 DIAGNOSIS — Z7982 Long term (current) use of aspirin: Secondary | ICD-10-CM

## 2013-11-24 DIAGNOSIS — Z87891 Personal history of nicotine dependence: Secondary | ICD-10-CM

## 2013-11-24 DIAGNOSIS — Z902 Acquired absence of lung [part of]: Secondary | ICD-10-CM

## 2013-11-24 DIAGNOSIS — I498 Other specified cardiac arrhythmias: Secondary | ICD-10-CM | POA: Diagnosis not present

## 2013-11-24 DIAGNOSIS — K5900 Constipation, unspecified: Secondary | ICD-10-CM | POA: Diagnosis present

## 2013-11-24 DIAGNOSIS — Z9889 Other specified postprocedural states: Secondary | ICD-10-CM | POA: Insufficient documentation

## 2013-11-24 DIAGNOSIS — R918 Other nonspecific abnormal finding of lung field: Secondary | ICD-10-CM

## 2013-11-24 DIAGNOSIS — R222 Localized swelling, mass and lump, trunk: Secondary | ICD-10-CM

## 2013-11-24 DIAGNOSIS — C3410 Malignant neoplasm of upper lobe, unspecified bronchus or lung: Principal | ICD-10-CM | POA: Diagnosis present

## 2013-11-24 DIAGNOSIS — I10 Essential (primary) hypertension: Secondary | ICD-10-CM | POA: Diagnosis present

## 2013-11-24 DIAGNOSIS — Z79899 Other long term (current) drug therapy: Secondary | ICD-10-CM

## 2013-11-24 DIAGNOSIS — Q348 Other specified congenital malformations of respiratory system: Secondary | ICD-10-CM

## 2013-11-24 HISTORY — PX: LOBECTOMY: SHX5089

## 2013-11-24 HISTORY — PX: VIDEO ASSISTED THORACOSCOPY: SHX5073

## 2013-11-24 SURGERY — VIDEO ASSISTED THORACOSCOPY
Anesthesia: General | Site: Chest | Laterality: Left

## 2013-11-24 MED ORDER — BISACODYL 5 MG PO TBEC
10.0000 mg | DELAYED_RELEASE_TABLET | Freq: Every day | ORAL | Status: DC
  Administered 2013-11-25 – 2013-11-28 (×4): 10 mg via ORAL
  Filled 2013-11-24 (×4): qty 2

## 2013-11-24 MED ORDER — LIDOCAINE HCL (CARDIAC) 20 MG/ML IV SOLN
INTRAVENOUS | Status: DC | PRN
  Administered 2013-11-24: 60 mg via INTRAVENOUS

## 2013-11-24 MED ORDER — PROPOFOL 10 MG/ML IV BOLUS
INTRAVENOUS | Status: DC | PRN
  Administered 2013-11-24: 30 mg via INTRAVENOUS
  Administered 2013-11-24: 120 mg via INTRAVENOUS
  Administered 2013-11-24: 50 mg via INTRAVENOUS
  Administered 2013-11-24: 30 mg via INTRAVENOUS
  Administered 2013-11-24 (×2): 50 mg via INTRAVENOUS
  Administered 2013-11-24 (×2): 30 mg via INTRAVENOUS

## 2013-11-24 MED ORDER — SUCCINYLCHOLINE CHLORIDE 20 MG/ML IJ SOLN
INTRAMUSCULAR | Status: AC
  Filled 2013-11-24: qty 1

## 2013-11-24 MED ORDER — ONDANSETRON HCL 4 MG/2ML IJ SOLN
INTRAMUSCULAR | Status: DC | PRN
  Administered 2013-11-24: 4 mg via INTRAVENOUS

## 2013-11-24 MED ORDER — ARTIFICIAL TEARS OP OINT
TOPICAL_OINTMENT | OPHTHALMIC | Status: AC
  Filled 2013-11-24: qty 3.5

## 2013-11-24 MED ORDER — LIDOCAINE HCL (CARDIAC) 20 MG/ML IV SOLN: INTRAVENOUS | Status: DC | PRN

## 2013-11-24 MED ORDER — TRAMADOL HCL 50 MG PO TABS: 50.0000 mg | ORAL_TABLET | Freq: Four times a day (QID) | ORAL | Status: DC | PRN

## 2013-11-24 MED ORDER — ACETAMINOPHEN 500 MG PO TABS
1000.0000 mg | ORAL_TABLET | Freq: Four times a day (QID) | ORAL | Status: DC
  Administered 2013-11-24 – 2013-11-25 (×2): 1000 mg via ORAL
  Filled 2013-11-24 (×2): qty 2

## 2013-11-24 MED ORDER — FENTANYL CITRATE 0.05 MG/ML IJ SOLN
INTRAMUSCULAR | Status: AC
  Filled 2013-11-24: qty 5

## 2013-11-24 MED ORDER — GLYCOPYRROLATE 0.2 MG/ML IJ SOLN
INTRAMUSCULAR | Status: AC
  Filled 2013-11-24: qty 2

## 2013-11-24 MED ORDER — ACETAMINOPHEN 160 MG/5ML PO SOLN: 1000.0000 mg | Freq: Four times a day (QID) | ORAL | Status: DC

## 2013-11-24 MED ORDER — LACTATED RINGERS IV SOLN
INTRAVENOUS | Status: DC | PRN
  Administered 2013-11-24: 07:00:00 via INTRAVENOUS

## 2013-11-24 MED ORDER — PHENYLEPHRINE HCL 10 MG/ML IJ SOLN
10.0000 mg | INTRAMUSCULAR | Status: DC | PRN
  Administered 2013-11-24: 25 ug/min via INTRAVENOUS

## 2013-11-24 MED ORDER — DIPHENHYDRAMINE HCL 12.5 MG/5ML PO ELIX
12.5000 mg | ORAL_SOLUTION | Freq: Four times a day (QID) | ORAL | Status: DC | PRN
  Filled 2013-11-24: qty 5

## 2013-11-24 MED ORDER — HYDRALAZINE HCL 20 MG/ML IJ SOLN
10.0000 mg | INTRAMUSCULAR | Status: DC | PRN
  Administered 2013-11-24 – 2013-11-28 (×2): 10 mg via INTRAVENOUS
  Filled 2013-11-24 (×2): qty 1

## 2013-11-24 MED ORDER — ONDANSETRON HCL 4 MG/2ML IJ SOLN: 4.0000 mg | Freq: Four times a day (QID) | INTRAMUSCULAR | Status: DC | PRN

## 2013-11-24 MED ORDER — NEOSTIGMINE METHYLSULFATE 1 MG/ML IJ SOLN
INTRAMUSCULAR | Status: AC
  Filled 2013-11-24: qty 10

## 2013-11-24 MED ORDER — STERILE WATER FOR INJECTION IJ SOLN
INTRAMUSCULAR | Status: AC
  Filled 2013-11-24: qty 10

## 2013-11-24 MED ORDER — SODIUM CHLORIDE 0.9 % IJ SOLN: 9.0000 mL | INTRAMUSCULAR | Status: DC | PRN

## 2013-11-24 MED ORDER — OXYCODONE-ACETAMINOPHEN 5-325 MG PO TABS: 1.0000 | ORAL_TABLET | ORAL | Status: DC | PRN

## 2013-11-24 MED ORDER — ONDANSETRON HCL 4 MG/2ML IJ SOLN
INTRAMUSCULAR | Status: AC
  Filled 2013-11-24: qty 2

## 2013-11-24 MED ORDER — NALOXONE HCL 0.4 MG/ML IJ SOLN
0.4000 mg | INTRAMUSCULAR | Status: DC | PRN
  Filled 2013-11-24: qty 1

## 2013-11-24 MED ORDER — GLYCOPYRROLATE 0.2 MG/ML IJ SOLN
INTRAMUSCULAR | Status: AC
Start: 2013-11-24 — End: 2013-11-24
  Filled 2013-11-24: qty 1

## 2013-11-24 MED ORDER — SENNOSIDES-DOCUSATE SODIUM 8.6-50 MG PO TABS
1.0000 | ORAL_TABLET | Freq: Every evening | ORAL | Status: DC | PRN
  Administered 2013-11-27: 1 via ORAL
  Filled 2013-11-24 (×2): qty 1

## 2013-11-24 MED ORDER — OXYCODONE HCL 5 MG PO TABS
5.0000 mg | ORAL_TABLET | ORAL | Status: AC | PRN
  Administered 2013-11-25: 10 mg via ORAL
  Filled 2013-11-24: qty 2

## 2013-11-24 MED ORDER — DEXTROSE 5 % IV SOLN
1.5000 g | Freq: Two times a day (BID) | INTRAVENOUS | Status: AC
  Administered 2013-11-24 – 2013-11-25 (×2): 1.5 g via INTRAVENOUS
  Filled 2013-11-24 (×3): qty 1.5

## 2013-11-24 MED ORDER — ONDANSETRON HCL 4 MG/2ML IJ SOLN
4.0000 mg | Freq: Four times a day (QID) | INTRAMUSCULAR | Status: DC | PRN
  Filled 2013-11-24: qty 2

## 2013-11-24 MED ORDER — DIPHENHYDRAMINE HCL 50 MG/ML IJ SOLN
12.5000 mg | Freq: Four times a day (QID) | INTRAMUSCULAR | Status: DC | PRN
  Filled 2013-11-24: qty 0.25

## 2013-11-24 MED ORDER — NEOSTIGMINE METHYLSULFATE 1 MG/ML IJ SOLN
INTRAMUSCULAR | Status: DC | PRN
  Administered 2013-11-24: 4 mg via INTRAVENOUS

## 2013-11-24 MED ORDER — 0.9 % SODIUM CHLORIDE (POUR BTL) OPTIME
TOPICAL | Status: DC | PRN
  Administered 2013-11-24: 1000 mL

## 2013-11-24 MED ORDER — DEXTROSE-NACL 5-0.9 % IV SOLN
INTRAVENOUS | Status: DC
  Administered 2013-11-24 – 2013-11-25 (×2): via INTRAVENOUS
  Administered 2013-11-26: 20 mL/h via INTRAVENOUS

## 2013-11-24 MED ORDER — POTASSIUM CHLORIDE 10 MEQ/50ML IV SOLN: 10.0000 meq | Freq: Every day | INTRAVENOUS | Status: DC | PRN

## 2013-11-24 MED ORDER — FENTANYL CITRATE 0.05 MG/ML IJ SOLN
INTRAMUSCULAR | Status: DC | PRN
  Administered 2013-11-24: 50 ug via INTRAVENOUS
  Administered 2013-11-24: 25 ug via INTRAVENOUS
  Administered 2013-11-24 (×2): 50 ug via INTRAVENOUS
  Administered 2013-11-24: 25 ug via INTRAVENOUS
  Administered 2013-11-24: 50 ug via INTRAVENOUS
  Administered 2013-11-24: 75 ug via INTRAVENOUS
  Administered 2013-11-24 (×2): 50 ug via INTRAVENOUS
  Administered 2013-11-24 (×2): 100 ug via INTRAVENOUS

## 2013-11-24 MED ORDER — ASPIRIN EC 81 MG PO TBEC
81.0000 mg | DELAYED_RELEASE_TABLET | Freq: Every day | ORAL | Status: DC
  Administered 2013-11-24 – 2013-11-29 (×6): 81 mg via ORAL
  Filled 2013-11-24 (×6): qty 1

## 2013-11-24 MED ORDER — PROPOFOL 10 MG/ML IV BOLUS
INTRAVENOUS | Status: AC
  Filled 2013-11-24: qty 20

## 2013-11-24 MED ORDER — MIDAZOLAM HCL 5 MG/5ML IJ SOLN
INTRAMUSCULAR | Status: DC | PRN
  Administered 2013-11-24: 2 mg via INTRAVENOUS

## 2013-11-24 MED ORDER — GLYCOPYRROLATE 0.2 MG/ML IJ SOLN
INTRAMUSCULAR | Status: DC | PRN
Start: 2013-11-24 — End: 2013-11-24
  Administered 2013-11-24: 0.2 mg via INTRAVENOUS
  Administered 2013-11-24: .6 mg via INTRAVENOUS

## 2013-11-24 MED ORDER — HEMOSTATIC AGENTS (NO CHARGE) OPTIME
TOPICAL | Status: DC | PRN
  Administered 2013-11-24: 1 via TOPICAL

## 2013-11-24 MED ORDER — ROCURONIUM BROMIDE 100 MG/10ML IV SOLN
INTRAVENOUS | Status: DC | PRN
  Administered 2013-11-24 (×4): 5 mg via INTRAVENOUS
  Administered 2013-11-24: 60 mg via INTRAVENOUS
  Administered 2013-11-24: 10 mg via INTRAVENOUS
  Administered 2013-11-24: 40 mg via INTRAVENOUS
  Administered 2013-11-24: 20 mg via INTRAVENOUS

## 2013-11-24 MED ORDER — FENTANYL 10 MCG/ML IV SOLN
INTRAVENOUS | Status: DC
  Administered 2013-11-24: 70 ug via INTRAVENOUS
  Administered 2013-11-24: 30 ug via INTRAVENOUS
  Administered 2013-11-24: 14:00:00 via INTRAVENOUS
  Administered 2013-11-25: 30 ug via INTRAVENOUS
  Administered 2013-11-25: 130 ug via INTRAVENOUS
  Administered 2013-11-25: 150 ug via INTRAVENOUS
  Administered 2013-11-25: 130 ug via INTRAVENOUS
  Administered 2013-11-25: 10 ug via INTRAVENOUS
  Administered 2013-11-25: 40 ug via INTRAVENOUS
  Administered 2013-11-26: 10 ug via INTRAVENOUS
  Administered 2013-11-26: 30 ug via INTRAVENOUS
  Administered 2013-11-26: 40 ug via INTRAVENOUS
  Administered 2013-11-26: 20 ug via INTRAVENOUS
  Administered 2013-11-26: 30 ug via INTRAVENOUS
  Administered 2013-11-26: 20 ug via INTRAVENOUS
  Administered 2013-11-27: 10 ug via INTRAVENOUS
  Administered 2013-11-27: 20 ug via INTRAVENOUS
  Administered 2013-11-27: 10 ug via INTRAVENOUS
  Administered 2013-11-27: 40 ug via INTRAVENOUS
  Administered 2013-11-27: 20 ug via INTRAVENOUS
  Administered 2013-11-28: 50 ug via INTRAVENOUS
  Administered 2013-11-28: 10:00:00 via INTRAVENOUS
  Administered 2013-11-28: 30 ug via INTRAVENOUS
  Administered 2013-11-28: 50 ug via INTRAVENOUS
  Administered 2013-11-28: 30 ug via INTRAVENOUS
  Filled 2013-11-24 (×4): qty 50

## 2013-11-24 MED ORDER — BUPIVACAINE 0.5 % ON-Q PUMP SINGLE CATH 400 ML
INJECTION | Status: DC | PRN
  Administered 2013-11-24: 400 mL

## 2013-11-24 MED ORDER — MIDAZOLAM HCL 2 MG/2ML IJ SOLN
INTRAMUSCULAR | Status: AC
  Filled 2013-11-24: qty 2

## 2013-11-24 MED ORDER — ROCURONIUM BROMIDE 50 MG/5ML IV SOLN
INTRAVENOUS | Status: AC
  Filled 2013-11-24: qty 2

## 2013-11-24 MED ORDER — ROCURONIUM BROMIDE 50 MG/5ML IV SOLN
INTRAVENOUS | Status: AC
  Filled 2013-11-24: qty 1

## 2013-11-24 MED ORDER — BUPIVACAINE 0.5 % ON-Q PUMP SINGLE CATH 400 ML
400.0000 mL | INJECTION | Status: DC
  Filled 2013-11-24: qty 400

## 2013-11-24 MED ORDER — EPHEDRINE SULFATE 50 MG/ML IJ SOLN
INTRAMUSCULAR | Status: AC
  Filled 2013-11-24: qty 1

## 2013-11-24 MED ORDER — HYDROMORPHONE HCL PF 1 MG/ML IJ SOLN: 0.2500 mg | INTRAMUSCULAR | Status: DC | PRN

## 2013-11-24 SURGICAL SUPPLY — 89 items
ADH SKN CLS APL DERMABOND .7 (GAUZE/BANDAGES/DRESSINGS) ×2
APPLICATOR COTTON TIP 6IN STRL (MISCELLANEOUS) ×2 IMPLANT
APPLIER CLIP ROT 10 11.4 M/L (STAPLE) ×4
APR CLP MED LRG 11.4X10 (STAPLE) ×2
BAG SPEC RTRVL LRG 6X4 10 (ENDOMECHANICALS)
BLADE SURG 11 STRL SS (BLADE) ×2 IMPLANT
CANISTER SUCTION 2500CC (MISCELLANEOUS) ×4 IMPLANT
CATH KIT ON Q 5IN SLV (PAIN MANAGEMENT) IMPLANT
CATH THORACIC 28FR (CATHETERS) ×2 IMPLANT
CATH THORACIC 28FR RT ANG (CATHETERS) IMPLANT
CATH THORACIC 36FR (CATHETERS) IMPLANT
CATH THORACIC 36FR RT ANG (CATHETERS) IMPLANT
CLIP APPLIE ROT 10 11.4 M/L (STAPLE) IMPLANT
CLIP TI MEDIUM 6 (CLIP) IMPLANT
CONN ST 1/4X3/8  BEN (MISCELLANEOUS) ×2
CONN ST 1/4X3/8 BEN (MISCELLANEOUS) IMPLANT
CONN Y 3/8X3/8X3/8  BEN (MISCELLANEOUS) ×4
CONN Y 3/8X3/8X3/8 BEN (MISCELLANEOUS) ×2 IMPLANT
CONT SPEC 4OZ CLIKSEAL STRL BL (MISCELLANEOUS) ×34 IMPLANT
COVER SURGICAL LIGHT HANDLE (MISCELLANEOUS) ×4 IMPLANT
DERMABOND ADVANCED (GAUZE/BANDAGES/DRESSINGS) ×2
DERMABOND ADVANCED .7 DNX12 (GAUZE/BANDAGES/DRESSINGS) IMPLANT
DRAIN CHANNEL 32F RND 10.7 FF (WOUND CARE) ×2 IMPLANT
DRAPE LAPAROSCOPIC ABDOMINAL (DRAPES) ×4 IMPLANT
DRAPE WARM FLUID 44X44 (DRAPE) ×4 IMPLANT
ELECT REM PT RETURN 9FT ADLT (ELECTROSURGICAL) ×4
ELECTRODE REM PT RTRN 9FT ADLT (ELECTROSURGICAL) ×2 IMPLANT
GLOVE SURG SIGNA 7.5 PF LTX (GLOVE) ×8 IMPLANT
GOWN STRL REUS W/ TWL LRG LVL3 (GOWN DISPOSABLE) ×4 IMPLANT
GOWN STRL REUS W/ TWL XL LVL3 (GOWN DISPOSABLE) ×2 IMPLANT
GOWN STRL REUS W/TWL LRG LVL3 (GOWN DISPOSABLE) ×8
GOWN STRL REUS W/TWL XL LVL3 (GOWN DISPOSABLE) ×4
HANDLE STAPLE ENDO GIA SHORT (STAPLE) ×2
HEMOSTAT SURGICEL 2X14 (HEMOSTASIS) IMPLANT
KIT BASIN OR (CUSTOM PROCEDURE TRAY) ×4 IMPLANT
KIT ROOM TURNOVER OR (KITS) ×4 IMPLANT
KIT SUCTION CATH 14FR (SUCTIONS) ×4 IMPLANT
NS IRRIG 1000ML POUR BTL (IV SOLUTION) ×8 IMPLANT
PACK CHEST (CUSTOM PROCEDURE TRAY) ×4 IMPLANT
PAD ARMBOARD 7.5X6 YLW CONV (MISCELLANEOUS) ×8 IMPLANT
PAIN PUMP ON-Q 400MLX5ML 5IN (MISCELLANEOUS) ×2 IMPLANT
PENCIL BUTTON HOLSTER BLD 10FT (ELECTRODE) ×2 IMPLANT
POUCH ENDO CATCH II 15MM (MISCELLANEOUS) ×2 IMPLANT
POUCH SPECIMEN RETRIEVAL 10MM (ENDOMECHANICALS) IMPLANT
RELOAD EGIA 45 MED/THCK PURPLE (STAPLE) ×6 IMPLANT
RELOAD EGIA 60 MED/THCK PURPLE (STAPLE) ×8 IMPLANT
RELOAD EGIA TRIS TAN 45 CVD (STAPLE) ×16 IMPLANT
RELOAD STAPLE 45 TAN MED CVD (STAPLE) IMPLANT
RELOAD STAPLE 60 MED/THCK ART (STAPLE) IMPLANT
SEALANT PROGEL (MISCELLANEOUS) IMPLANT
SEALANT SURG COSEAL 4ML (VASCULAR PRODUCTS) IMPLANT
SEALANT SURG COSEAL 8ML (VASCULAR PRODUCTS) IMPLANT
SOLUTION ANTI FOG 6CC (MISCELLANEOUS) ×4 IMPLANT
SPECIMEN JAR MEDIUM (MISCELLANEOUS) ×4 IMPLANT
SPONGE GAUZE 4X4 12PLY (GAUZE/BANDAGES/DRESSINGS) ×4 IMPLANT
SPONGE GAUZE 4X4 12PLY STER LF (GAUZE/BANDAGES/DRESSINGS) ×2 IMPLANT
SPONGE INTESTINAL PEANUT (DISPOSABLE) IMPLANT
STAPLER ENDO GIA 12 SHRT THIN (STAPLE) IMPLANT
STAPLER ENDO GIA 12MM SHORT (STAPLE) ×2 IMPLANT
SUT PROLENE 4 0 RB 1 (SUTURE)
SUT PROLENE 4-0 RB1 .5 CRCL 36 (SUTURE) IMPLANT
SUT SILK  1 MH (SUTURE) ×4
SUT SILK 1 MH (SUTURE) ×4 IMPLANT
SUT SILK 2 0SH CR/8 30 (SUTURE) IMPLANT
SUT SILK 3 0 SH CR/8 (SUTURE) ×2 IMPLANT
SUT SILK 3 0SH CR/8 30 (SUTURE) IMPLANT
SUT VIC AB 1 CTX 36 (SUTURE) ×4
SUT VIC AB 1 CTX36XBRD ANBCTR (SUTURE) IMPLANT
SUT VIC AB 2-0 CT1 27 (SUTURE) ×4
SUT VIC AB 2-0 CT1 TAPERPNT 27 (SUTURE) IMPLANT
SUT VIC AB 2-0 CTX 36 (SUTURE) IMPLANT
SUT VIC AB 2-0 UR6 27 (SUTURE) IMPLANT
SUT VIC AB 3-0 MH 27 (SUTURE) IMPLANT
SUT VIC AB 3-0 X1 27 (SUTURE) ×6 IMPLANT
SUT VICRYL 2 TP 1 (SUTURE) ×2 IMPLANT
SWAB COLLECTION DEVICE MRSA (MISCELLANEOUS) IMPLANT
SYSTEM SAHARA CHEST DRAIN ATS (WOUND CARE) ×4 IMPLANT
TAPE CLOTH 4X10 WHT NS (GAUZE/BANDAGES/DRESSINGS) ×4 IMPLANT
TAPE CLOTH SURG 4X10 WHT LF (GAUZE/BANDAGES/DRESSINGS) ×2 IMPLANT
TIP APPLICATOR SPRAY EXTEND 16 (VASCULAR PRODUCTS) ×2 IMPLANT
TOWEL OR 17X24 6PK STRL BLUE (TOWEL DISPOSABLE) ×4 IMPLANT
TOWEL OR 17X26 10 PK STRL BLUE (TOWEL DISPOSABLE) ×8 IMPLANT
TRAP SPECIMEN MUCOUS 40CC (MISCELLANEOUS) IMPLANT
TRAY FOLEY CATH 16FRSI W/METER (SET/KITS/TRAYS/PACK) ×4 IMPLANT
TROCAR XCEL NON-BLD 5MMX100MML (ENDOMECHANICALS) IMPLANT
TUBE ANAEROBIC SPECIMEN COL (MISCELLANEOUS) IMPLANT
TUNNELER SHEATH ON-Q 11GX8 (MISCELLANEOUS) ×2 IMPLANT
TUNNELER SHEATH ON-Q 11GX8 DSP (PAIN MANAGEMENT) IMPLANT
WATER STERILE IRR 1000ML POUR (IV SOLUTION) ×8 IMPLANT

## 2013-11-24 NOTE — Progress Notes (Signed)
Report given to maria rn as caregiver 

## 2013-11-24 NOTE — Brief Op Note (Addendum)
11/24/2013  11:41 AM  PATIENT:  Adam [name]  80 y.o. male  PRE-OPERATIVE DIAGNOSIS:  LUL MASS  POST-OPERATIVE DIAGNOSIS:  Non-small cell lung cancer Left Upper Lobe  PROCEDURE:  Procedure(s): VIDEO ASSISTED THORACOSCOPY THORACOSCOPIC LEFT UPPER LOBECTOMY  MEDIASTINAL LYMPH NODE DISSECTION  MEDIASTINAL CYST RESECTION ON-Q LOCAL ANESTHETIC CATHETER PLACEMENT  SURGEON:  Surgeon(s): Melrose Nakayama, MD  PHYSICIAN ASSISTANT: WAYNE GOLD PA-C  ANESTHESIA:   general  SPECIMEN:  Source of Specimen:  AS ABOVE  DISPOSITION OF SPECIMEN:  Pathology  DRAINS: 2  Chest Tube(s) in the LEFT HEMITHORAX   PATIENT CONDITION:  ICU - intubated and hemodynamically stable.  PRE-OPERATIVE WEIGHT: 24QA  COMPLICATIONS: NO KNOWN  FINDINGS- ~2 cm mass in middle of LUL. Non-small cell cancer(favor adenocarcinoma) by frozen. Margins clear  Multiple large but otherwise benign appearing lymph nodes  Simple cyst in mediastinum

## 2013-11-24 NOTE — Preoperative (Signed)
Beta Blockers   Reason not to administer Beta Blockers:Not Applicable 

## 2013-11-24 NOTE — Anesthesia Postprocedure Evaluation (Signed)
  Anesthesia Post-op Note  Patient: Adam [name]  Procedure(s) Performed: Procedure(s) with comments: VIDEO ASSISTED THORACOSCOPY (Left) LOBECTOMY (Left) - POSSIBLE LUL LOBECTOMY  Patient Location: PACU  Anesthesia Type:General  Level of Consciousness: awake and alert   Airway and Oxygen Therapy: Patient Spontanous Breathing  Post-op Pain: none  Post-op Assessment: Post-op Vital signs reviewed, Patient's Cardiovascular Status Stable and Respiratory Function Stable  Post-op Vital Signs: Reviewed  Filed Vitals:   11/24/13 1400  BP:   Pulse: 69  Temp:   Resp: 25    Complications: No apparent anesthesia complications

## 2013-11-24 NOTE — Interval H&P Note (Signed)
History and Physical Interval Note:  11/24/2013 7:23 AM  Adam [name]  has presented today for surgery, with the diagnosis of LUL MASS  The various methods of treatment have been discussed with the patient and family. After consideration of risks, benefits and other options for treatment, the patient has consented to  Procedure(s) with comments: VIDEO ASSISTED THORACOSCOPY (Left) WEDGE RESECTION (Left) LOBECTOMY (Left) - POSSIBLE LUL LOBECTOMY as a surgical intervention .  The patient's history has been reviewed, patient examined, no change in status, stable for surgery.  I have reviewed the patient's chart and labs.  Questions were answered to the patient's satisfaction.     Iviona Hole C

## 2013-11-24 NOTE — Progress Notes (Signed)
Patient ID: Adam [name], male   DOB: 09/24/1928, 80 y.o.   MRN: [id number]  SICU Evening Rounds:  Hemodynamically stable  Pain under control  Small air leak from chest tubes. Drainage low.  Urine output good.

## 2013-11-24 NOTE — Transfer of Care (Signed)
Immediate Anesthesia Transfer of Care Note  Patient: Adam [name]  Procedure(s) Performed: Procedure(s) with comments: VIDEO ASSISTED THORACOSCOPY (Left) LOBECTOMY (Left) - POSSIBLE LUL LOBECTOMY  Patient Location: PACU  Anesthesia Type:General  Level of Consciousness: sedated and patient cooperative  Airway & Oxygen Therapy: Patient Spontanous Breathing and Patient connected to face mask oxygen  Post-op Assessment: Report given to PACU RN and Post -op Vital signs reviewed and stable  Post vital signs: Reviewed and stable  Complications: No apparent anesthesia complications

## 2013-11-24 NOTE — Progress Notes (Signed)
Utilization Review Completed.Donne Anon T3/30/2015

## 2013-11-24 NOTE — H&P (View-Only) (Signed)
PCP is Leeanne Rio, PA-C Referring Provider is Marin Olp Rudell Cobb, MD  Chief Complaint  Patient presents with  . Lung Mass    Surgical eval on left upper lobe lung mass, PET Scan 10/24/13, Chest CT 10/03/13    HPI: Adam [name] is an 80 year old gentleman sent for consultation by Dr. and over regarding a left upper lobe mass.  Adam [name] is an 80 year old gentleman with a remote history of tobacco abuse and asbestos exposure. He smoked 2 packs a day for about 10 years but quit 45 years ago. He also had some exposure to asbestos. He worked in [employer].  He recently moved to [place] from [facility]. He had a productive cough about 2-3 weeks ago. He went to an urgent care to get that checked out. He was treated with antibiotics for bronchitis. They did recommend a chest x-ray. It showed a left upper lobe opacity. A CT of the chest was done which showed a 1.6 cm spiculated left upper lobe mass. He was referred to Dr. Burney Gauze. He did a PET/CT which showed the lesion was hypermetabolic with an SUV of 11.  He says he's lost a few pounds, 3 over the past 3 months and 5 over the past 6 months. He says he does have decreased energy which he attributes to "old age." He does not have any chest pain, pressure, or tightness. He remains very physically active. He says that he would get short of breath if he ran 50 yards. But he can walk half a mile without shortness of breath. He also can get up 2 flights of stairs without stopping. He has not had any hemoptysis. He denies wheezing.  ECOG/ZUBROD= 0   Past Medical History  Diagnosis Date  . Hypertension   . Chicken pox   . BC (bronchogenic carcinoma)     Past Surgical History  Procedure Laterality Date  . Umbilical hernia repair    . Tonsillectomy    . Wisdom tooth extraction      Family History  Problem Relation Age of Onset  . Hypertension Father     Deceased  . Stroke Father 74  . Ulcers Father    . Alcoholism Father   . Hyperlipidemia Father   . Glaucoma Mother 27    Deceased  . Dementia Sister   . Breast cancer Sister   . Healthy Brother   . Breast cancer Daughter   . Thyroid cancer Daughter     Social History History  Substance Use Topics  . Smoking status: Former Smoker -- 2.00 packs/day for 12 years    Types: Cigarettes    Start date: 12/15/1955    Quit date: 10/16/1968  . Smokeless tobacco: Never Used     Comment: quit 45 years ago  . Alcohol Use: 4.2 oz/week    7 Glasses of wine per week    Current Outpatient Prescriptions  Medication Sig Dispense Refill  . aspirin 81 MG tablet Take 81 mg by mouth daily.      Marland Kitchen CALCIUM-VITAMIN D PO Take by mouth daily.      . Cyanocobalamin (VITAMIN B-12) 1000 MCG SUBL Place under the tongue daily.      Marland Kitchen losartan (COZAAR) 50 MG tablet Take 50 mg by mouth daily.      . magnesium 30 MG tablet Take 30 mg by mouth daily.      Marland Kitchen omega-3 fish oil (MAXEPA) 1000 MG CAPS capsule Take by mouth daily.  No current facility-administered medications for this visit.    Allergies  Allergen Reactions  . Adhesive [Tape]     Skin Irritation  . Latex     Review of Systems  Constitutional: Positive for unexpected weight change (lost 3 pounds in 3 months, 5 pounds in 6 months).       Decreased energy  Eyes: Positive for visual disturbance.  Respiratory: Positive for cough and shortness of breath (with heavy exertion). Negative for wheezing and stridor.        Recent bronchitis  Cardiovascular: Negative for chest pain and leg swelling.  Skin: Positive for rash.  All other systems reviewed and are negative.    BP 150/82  Pulse 70  Resp 20  Ht 5\' 8"  (1.727 m)  Wt 182 lb (82.555 kg)  BMI 27.68 kg/m2  SpO2 98% Physical Exam  Vitals reviewed. Constitutional: He is oriented to person, place, and time. He appears well-developed and well-nourished. No distress.  HENT:  Head: Normocephalic and atraumatic.  Eyes: EOM are  normal. Pupils are equal, round, and reactive to light.  Neck: Neck supple. No thyromegaly present.  Cardiovascular: Normal rate, regular rhythm, normal heart sounds and intact distal pulses.  Exam reveals no gallop and no friction rub.   No murmur heard. Pulmonary/Chest: Effort normal and breath sounds normal. He has no wheezes. He has no rales.  Abdominal: Soft. There is no tenderness.  Musculoskeletal: He exhibits no edema.  Lymphadenopathy:    He has no cervical adenopathy.  Neurological: He is alert and oriented to person, place, and time. No cranial nerve deficit.  No focal motor deficit     Diagnostic Tests:  CT CHEST WITHOUT CONTRAST 10/03/2013 TECHNIQUE:  Multidetector CT imaging of the chest was performed following the  standard protocol without IV contrast.  COMPARISON: No available priors.  FINDINGS:  There are scattered unenlarged lymph nodes in the mediastinum. A  rounded ovoid low-attenuation structure lateral to the left  pulmonary artery measures 2.2 x 2.5 cm and 8 Hounsfield units. Hilar  regions are difficult to definitively evaluate without IV contrast.  No axillary adenopathy. Ascending aorta measures approximately 4.1  cm. Coronary artery calcification. Heart is mildly enlarged.  Lipomatous hypertrophy of the interatrial septum is incidentally  noted. No pericardial effusion.  Spiculated nodule in the left upper lobe measures 1.5 x 1.6 cm.  Additional scattered tiny pulmonary nodules measure 3 mm or less in  size. Mild basilar predominant subpleural reticulation. There may be  a calcified granuloma in the right lower lobe. No pleural fluid.  Minimal debris in the trachea.  Incidental imaging of the upper abdomen shows the visualized  portions of the liver, gallbladder, adrenal glands, kidneys, spleen,  pancreas, stomach and bowel to the grossly unremarkable. No upper  abdominal adenopathy. No worrisome lytic or sclerotic lesions.  Degenerative changes are  seen in the spine.  IMPRESSION:  1. Spiculated left upper lobe nodule is highly worrisome for primary  bronchogenic carcinoma. These results will be called to the ordering  clinician or representative by the Radiologist Assistant, and  communication documented in the PACS Dashboard.  2. Low-density structure along the left pulmonary artery may be  pericardial in origin. Continued attention on followup exams is  warranted.  3. Borderline ascending aortic aneurysm with coronary artery  calcification.  4. Mild bibasilar subpleural fibrosis.  Electronically Signed  By: Lorin Picket M.D.  On: 10/03/2013 15:41  PET/CT 10/24/2013 NUCLEAR MEDICINE PET SKULL BASE TO THIGH  FASTING BLOOD GLUCOSE:  Value: 117 mg/dl  TECHNIQUE:  9.7 mCi F-18 FDG was injected intravenously. Full-ring PET imaging  was performed from the skull base to thigh after the radiotracer. CT  data was obtained and used for attenuation correction and anatomic  localization.  COMPARISON: No comparisons  FINDINGS:  NECK  No hypermetabolic lymph nodes in the neck.  CHEST  16 mm x 15 mm spiculated nodule in the left upper lobe is a  hypermetabolic with intense metabolic activity (SUV max 11.0). No  additional of pulmonary nodules. There is mild basilar atelectasis.  No hypermetabolic mediastinal lymph nodes. There is a rounded fluid  density structure anterior to the left main pulmonary artery within  the prevascular space which has no metabolic activity consistent  with a cardiac recess or atrial appendage recess.  ABDOMEN/PELVIS  There is a midl hypermetabolic activity associated with the left  adrenal gland which is less than the liver. There is minimal  nodularity. This is felt to represent benign hyperplasia. No  abnormal activity in the liver. No hypermetabolic abdominal pelvic  lymph nodes.  SKELETON  No focal hypermetabolic activity to suggest skeletal metastasis.  IMPRESSION:  1. Hypermetabolic left upper  lobe pulmonary nodule with no evidence  of mediastinal metastasis or distant metastasis. Staging by FDG PET  imaging T1a N0 M0.  2. Minimally hypermetabolic left adrenal gland is felt to represent  benign hyperplasia.  Electronically Signed  By: Suzy Bouchard M.D.  On: 10/24/2013 13:55   Impression: 80 year old gentleman with some level of tobacco and as best as exposure in the past who has a 1.6 cm spiculated nodule in the left upper lobe. This is markedly hypermetabolic on PET CT with an SUV of 11. This needs to be considered a new primary bronchogenic carcinoma must be proven otherwise. The only way to definitively proven otherwise would be to completely resect the mass.  I discussed the differential diagnosis with Mr. and Adam [name]. Adam [name] has recently had surgery and chemotherapy for a stage IIA non-small cell carcinoma, so they are very familiar with the disease and its workup and treatment.  I did discuss possible treatments including surgery and radiation. We also discussed topical about making the diagnosis and whether it would be worthwhile to do a bronchoscopy prior to surgical resection. In my opinion there really is no reason to do bronchoscopy is the lesion is suspicious enough that a warm resection regardless of the findings at bronchoscopy.  I discussed in detail with Mr. and Adam [name] the proposed procedure, left VATS, possible wedge resection, possible left upper lobectomy. They understand that this lesion could potentially require a lobectomy given its central location. They also understand that a lobectomy would be done if the lesion were cancerous as is the best treatment for that disease. We discussed the general nature of the procedure, the need for general anesthesia, the incisions to be used, expected hospital stay, and the overall recovery. We discussed the indications, risks, benefits, and alternatives. They understand that the risk include, but are not  limited to death, MI, stroke, DVT, PE, bleeding, possible need for transfusion, infection, prolonged air leak, cardiac arrhythmias, as well as the possibility of unforeseeable complications.  Adam [name] wishes to be aggressive and have the lesion resected.   Plan: Pulmonary function testing with room air blood gas  Left VATS, possible wedge resection, possible left upper lobectomy on Monday, March 30.

## 2013-11-24 NOTE — Anesthesia Procedure Notes (Signed)
Procedure Name: Intubation Date/Time: 11/24/2013 7:44 AM Performed by: Wanita Chamberlain Pre-anesthesia Checklist: Patient identified, Emergency Drugs available, Suction available, Patient being monitored and Timeout performed Patient Re-evaluated:Patient Re-evaluated prior to inductionOxygen Delivery Method: Circle system utilized Preoxygenation: Pre-oxygenation with 100% oxygen Intubation Type: IV induction Ventilation: Mask ventilation without difficulty Laryngoscope Size: Mac and 3 Grade View: Grade I Tube type: Oral Endobronchial tube: Left and Double lumen EBT and 39 Fr Number of attempts: 1 Airway Equipment and Method: Stylet and Fiberoptic brochoscope Placement Confirmation: ETT inserted through vocal cords under direct vision,  positive ETCO2 and breath sounds checked- equal and bilateral Tube secured with: Tape Dental Injury: Teeth and Oropharynx as per pre-operative assessment

## 2013-11-24 NOTE — Anesthesia Preprocedure Evaluation (Addendum)
Anesthesia Evaluation  Patient identified by MRN, date of birth, ID band Patient awake    Reviewed: Allergy & Precautions, H&P , NPO status , Patient's Chart, lab work & pertinent test results  Airway Mallampati: II    Mouth opening: Limited Mouth Opening  Dental no notable dental hx. (+) Teeth Intact, Caps, Dental Advisory Given,    Pulmonary shortness of breath, former smoker,  Quit smoking 45 yrs ago breath sounds clear to auscultation  Pulmonary exam normal       Cardiovascular hypertension, Pt. on medications Rhythm:Regular Rate:Normal     Neuro/Psych negative neurological ROS  negative psych ROS   GI/Hepatic negative GI ROS, Neg liver ROS,   Endo/Other  negative endocrine ROS  Renal/GU negative Renal ROS  negative genitourinary   Musculoskeletal  (+) Arthritis -, Osteoarthritis,    Abdominal Normal abdominal exam  (+)   Peds  Hematology negative hematology ROS (+)   Anesthesia Other Findings Full upper and lower crowns  Reproductive/Obstetrics negative OB ROS                      Anesthesia Physical Anesthesia Plan  ASA: III  Anesthesia Plan: General   Post-op Pain Management:    Induction: Intravenous  Airway Management Planned: Double Lumen EBT  Additional Equipment: Arterial line, CVP and Ultrasound Guidance Line Placement  Intra-op Plan:   Post-operative Plan: Extubation in OR and Possible Post-op intubation/ventilation  Informed Consent: I have reviewed the patients History and Physical, chart, labs and discussed the procedure including the risks, benefits and alternatives for the proposed anesthesia with the patient or authorized representative who has indicated his/her understanding and acceptance.   Dental advisory given  Plan Discussed with: CRNA and Surgeon  Anesthesia Plan Comments:         Anesthesia Quick Evaluation

## 2013-11-25 ENCOUNTER — Inpatient Hospital Stay (HOSPITAL_COMMUNITY): Payer: Medicare Other

## 2013-11-25 ENCOUNTER — Encounter (HOSPITAL_COMMUNITY): Payer: Self-pay | Admitting: Thoracic Surgery (Cardiothoracic Vascular Surgery)

## 2013-11-25 LAB — BASIC METABOLIC PANEL
BUN: 13 mg/dL (ref 6–23)
CALCIUM: 8.3 mg/dL — AB (ref 8.4–10.5)
CO2: 23 mEq/L (ref 19–32)
Chloride: 105 mEq/L (ref 96–112)
Creatinine, Ser: 0.66 mg/dL (ref 0.50–1.35)
GFR calc Af Amer: >90 mL/min (ref >90)
GFR calc non Af Amer: 88 mL/min — ABNORMAL LOW (ref >90)
GLUCOSE: 126 mg/dL — AB (ref 70–99)
POTASSIUM: 3.8 meq/L (ref 3.7–5.3)
SODIUM: 140 meq/L (ref 137–147)

## 2013-11-25 LAB — BLOOD GAS, ARTERIAL
Acid-Base Excess: 0.4 mmol/L (ref 0.0–2.0)
Bicarbonate: 24.8 mEq/L — ABNORMAL HIGH (ref 20.0–24.0)
Drawn by: 39899
FIO2: 0.21 %
O2 Saturation: 95.4 %
PATIENT TEMPERATURE: 97.6
PCO2 ART: 41.3 mmHg (ref 35.0–45.0)
PH ART: 7.393 (ref 7.350–7.450)
TCO2: 26.1 mmol/L (ref 0–100)
pO2, Arterial: 75.3 mmHg — ABNORMAL LOW (ref 80.0–100.0)

## 2013-11-25 LAB — CBC
HEMATOCRIT: 39.9 % (ref 39.0–52.0)
Hemoglobin: 14.1 g/dL (ref 13.0–17.0)
MCH: 33.4 pg (ref 26.0–34.0)
MCHC: 35.3 g/dL (ref 30.0–36.0)
MCV: 94.5 fL (ref 78.0–100.0)
Platelets: 132 10*3/uL — ABNORMAL LOW (ref 150–400)
RBC: 4.22 MIL/uL (ref 4.22–5.81)
RDW: 12.6 % (ref 11.5–15.5)
WBC: 10.2 10*3/uL (ref 4.0–10.5)

## 2013-11-25 LAB — TYPE AND SCREEN
ABO/RH(D): O POS
ANTIBODY SCREEN: NEGATIVE
UNIT DIVISION: 0
Unit division: 0

## 2013-11-25 LAB — BLOOD PRODUCT ORDER (VERBAL) VERIFICATION

## 2013-11-25 MED ORDER — ACETAMINOPHEN 325 MG PO TABS
650.0000 mg | ORAL_TABLET | Freq: Four times a day (QID) | ORAL | Status: AC
  Administered 2013-11-25 – 2013-11-26 (×6): 650 mg via ORAL
  Filled 2013-11-25 (×6): qty 2

## 2013-11-25 MED ORDER — LOSARTAN POTASSIUM 50 MG PO TABS
50.0000 mg | ORAL_TABLET | Freq: Every day | ORAL | Status: DC
  Administered 2013-11-25 – 2013-11-29 (×5): 50 mg via ORAL
  Filled 2013-11-25 (×5): qty 1

## 2013-11-25 MED ORDER — ENOXAPARIN SODIUM 40 MG/0.4ML ~~LOC~~ SOLN
40.0000 mg | SUBCUTANEOUS | Status: DC
  Administered 2013-11-25 – 2013-11-29 (×5): 40 mg via SUBCUTANEOUS
  Filled 2013-11-25 (×5): qty 0.4

## 2013-11-25 NOTE — Care Management Note (Signed)
    Page 1 of 1   11/25/2013     11:31:18 AM   CARE MANAGEMENT NOTE 11/25/2013  Patient:  GLEASON, ARDOIN   Account Number:  [id number]  Date Initiated:  11/24/2013  Documentation initiated by:  [facility]  Subjective/Objective Assessment:   Post op thoracotomy for lung mass     Action/Plan:   Anticipated DC Date:  11/28/2013   Anticipated DC Plan:  [place]  CM consult      Choice offered to / List presented to:             Status of service:  In process, will continue to follow Medicare Important Message given?   (If response is "NO", the following Medicare IM given date fields will be blank) Date Medicare IM given:   Date Additional Medicare IM given:    Discharge Disposition:    Per UR Regulation:  Reviewed for med. necessity/level of care/duration of stay  If discussed at [place] of Stay Meetings, dates discussed:    Comments:  ContactFrancine Graven Spouse [phone]  11-25-13 11:30am Luz Lex, [place] [phone] Patient and wife live at [place].  Wife states per staff plan for SNF - rehab once discharged.  SW consult placed to talk with wife.

## 2013-11-25 NOTE — Plan of Care (Signed)
Problem: Phase II Progression Outcomes Goal: Discharge plan established Outcome: Completed/Met Date Met:  11/25/13 Patient to go to Rehab at [facility] upon discharge.

## 2013-11-25 NOTE — Progress Notes (Addendum)
[facility] DAILY ICU PROGRESS NOTE                   [address]            [place] [address]          [phone]   1 Day Post-Op Procedure(s) (LRB): VIDEO ASSISTED THORACOSCOPY (Left) LOBECTOMY (Left)  Total Length of Stay:  LOS: 1 day   Subjective: Feels mild SOB, pain is well controlled  Objective: Vital signs in last 24 hours: Temp:  [97.6 F (36.4 C)-97.8 F (36.6 C)] 97.6 F (36.4 C) (03/31 0716) Pulse Rate:  [54-90] 69 (03/31 0700) Cardiac Rhythm:  [-] Sinus bradycardia (03/31 0400) Resp:  [16-26] 20 (03/31 0700) BP: (113-152)/(39-93) 129/78 mmHg (03/31 0700) SpO2:  [92 %-100 %] 92 % (03/31 0700) Arterial Line BP: (95-185)/(47-90) 153/55 mmHg (03/31 0700) Weight:  [179 lb 0.2 oz (81.2 kg)-180 lb 8.9 oz (81.9 kg)] 179 lb 0.2 oz (81.2 kg) (03/31 0500)  Filed Weights   11/24/13 1500 11/25/13 0500  Weight: 180 lb 8.9 oz (81.9 kg) 179 lb 0.2 oz (81.2 kg)    Weight change:    Hemodynamic parameters for last 24 hours:    Intake/Output from previous day: 03/30 0701 - 03/31 0700 In: 3380 [P.O.:240; I.V.:3090; IV Piggyback:50] Out: 1932 [Urine:1220; Blood:150; Chest Tube:562]  Intake/Output this shift:    Current Meds: Scheduled Meds: . acetaminophen  1,000 mg Oral 4 times per day   Or  . acetaminophen (TYLENOL) oral liquid 160 mg/5 mL  1,000 mg Oral 4 times per day  . aspirin EC  81 mg Oral Daily  . bisacodyl  10 mg Oral Daily  . cefUROXime (ZINACEF)  IV  1.5 g Intravenous Q12H  . fentaNYL   Intravenous 6 times per day   Continuous Infusions: . dextrose 5 % and 0.9% NaCl 125 mL/hr at 11/25/13 0400   PRN Meds:.diphenhydrAMINE, diphenhydrAMINE, hydrALAZINE, naloxone, ondansetron (ZOFRAN) IV, ondansetron (ZOFRAN) IV, oxyCODONE, oxyCODONE-acetaminophen, oxyCODONE-acetaminophen, potassium chloride, senna-docusate, sodium chloride, traMADol  General appearance: alert, cooperative and no distress Heart: regular rate and rhythm Lungs: dim in left  base Abdomen: mild dist, soft, nontender Extremities: no edema Wound: dressings CDI  Lab Results: CBC: Recent Labs  11/25/13 0410  WBC 10.2  HGB 14.1  HCT 39.9  PLT 132*   BMET:  Recent Labs  11/25/13 0410  NA 140  K 3.8  CL 105  CO2 23  GLUCOSE 126*  BUN 13  CREATININE 0.66  CALCIUM 8.3*    ABG    Component Value Date/Time   PHART 7.393 11/25/2013 0409   PCO2ART 41.3 11/25/2013 0409   PO2ART 75.3* 11/25/2013 0409   HCO3 24.8* 11/25/2013 0409   TCO2 26.1 11/25/2013 0409   O2SAT 95.4 11/25/2013 0409      PT/INR: No results found for this basename: LABPROT, INR,  in the last 72 hours Radiology: Dg Chest 2 View  11/24/2013   CLINICAL DATA:  Preoperative evaluation, lung cancer.  EXAM: CHEST  2 VIEW  COMPARISON:  NM PET IMAGE INITIAL (PI) SKULL BASE TO THIGH dated 10/24/2013  FINDINGS: Cardiomediastinal silhouette is unremarkable. Left upper lobe pulmonary nodule is seen on prior PET-CT. Mild chronic interstitial changes with increased lung volumes. The lungs are otherwise clear without pleural effusions or focal consolidations. Trachea projects midline and there is no pneumothorax. Soft tissue planes and included osseous structures are non-suspicious.  IMPRESSION: Left upper lobe pulmonary nodule corresponding to PET-CT abnormality. Mild chronic  interstitial changes/COPD without acute cardiopulmonary process.   Electronically Signed   By: Elon Alas   On: 11/24/2013 06:39   Dg Chest Portable 1 View  11/24/2013   CLINICAL DATA:  Status post left lung surgery. Central line placement.  EXAM: PORTABLE CHEST - 1 VIEW  COMPARISON:  11/24/2013 at 5:32 a.m.  FINDINGS: Left lung surgery has been performed since the earlier study. There are 2 chest tubes on the left both with their tips near the left apex. No pneumothorax is seen on this semi-erect exam.  Lung volumes are low.  There is mild basilar atelectasis.  No edema.  Right internal jugular central venous line has its tip in  the lower superior vena cava.  Cardiac silhouette is normal in size.  IMPRESSION: 1. Status post left lung surgery. Well-positioned left chest tubes. No pneumothorax. 2. Right internal jugular central venous line has its tip in the lower superior vena cava. 3. Mild basilar atelectasis accentuated by low lung volumes.   Electronically Signed   By: Lajean Manes M.D.   On: 11/24/2013 14:15   Chest tube: + air leak, 562 cc drainage    Assessment/Plan: S/P Procedure(s) (LRB): VIDEO ASSISTED THORACOSCOPY (Left) LOBECTOMY (Left)  1 doing well 2 H/H good 3 renal fxn good, some HTN, will restart home cozaar 4 d/c aline and foley 5 keep CT's for air leak/drainage- poss d/c one later today 6 routine pulm toilet and rehab    GOLD,WAYNE E  11/25/2013 7:37 AM  Keep both chest tubes to suction today Mobilize SCD + enoxaparin for DVT prophylaxis

## 2013-11-25 NOTE — Op Note (Signed)
NAME:  Adam [name], Adam [name] NO.:  [id number]  MEDICAL RECORD NO.:  [id number]  LOCATION:  2S16C                        FACILITY:  [facility]  PHYSICIAN:  Revonda Standard. Roxan Hockey, M.D.DATE OF BIRTH:  07/14/1929  DATE OF PROCEDURE:  11/24/2013 DATE OF DISCHARGE:                              OPERATIVE REPORT   PREOPERATIVE DIAGNOSIS:  Left upper lobe mass.  POSTOPERATIVE DIAGNOSIS:  Non-small-cell carcinoma, left upper lobe, clinical stage IA.  PROCEDURE:  Left video-assisted thoracoscopy, thoracoscopic left upper lobectomy, mediastinal lymph node dissection, resection of mediastinal cyst, On-Q local anesthetic catheter placement.  SURGEON:  Revonda Standard. Roxan Hockey, MD  ASSISTANT:  John Giovanni, PA-C  ANESTHESIA:  General.  FINDINGS:  A 2-cm mass centrally located in the left upper lobe. There was no way to wedge the mass without leaving a nonfunctional remnant, therefore proceeded with left upper lobectomy.  Frozen section revealed non-small- cell carcinoma, probable adenocarcinoma, margins clear. Multiple, enlarged but otherwise benign-appearing lymph nodes.  Thin walled simple cyst in the middle mediastinum resected.  CLINICAL NOTE:  Adam [name] is an 80 year old gentleman with a remote history of tobacco abuse and asbestos exposure.  He presented with a productive cough.  He was treated with antibiotics, but a chest x-ray showed a left upper lobe opacity.  A CT of the chest showed a 1.6-cm spiculated mass on PET-CT, this lesion was hypermetabolic with an SUV of 11.  The patient was advised to undergo surgical resection with a plan for a left video-assisted thoracoscopy, possible wedge resection or possible lobectomy depending on intraoperative findings.  The indications, risks, benefits, and alternatives were discussed in detail. He understood and accepted the risks and agreed to proceed.  OPERATIVE NOTE:  Adam [name] was brought to the preoperative holding area  on November 24, 2013, there Anesthesia placed a central line and arterial blood pressure monitoring line.  He was taken to the operating room, anesthetized, and intubated with a double-lumen endotracheal tube. Intravenous antibiotics were administered.  Foley catheter was placed. Sequential compression devices were placed on the calves for DVT prophylaxis.  He was placed in a right lateral decubitus position and the left chest was prepped and draped in the usual sterile fashion. Single lung ventilation of the right lung was initiated, and was tolerated well throughout the procedure.  An incision was made in the seventh intercostal space in the midaxillary line and was carried through the skin and subcutaneous tissue.  The chest was entered bluntly using a 5 mm port.  The 5 mm thoracoscope was advanced into the chest.  There was good isolation of the left lung. Suction was applied to decompress the lung as there was air trapping. After that, there was good visualization and isolation of the lung throughout the remainder of the case.  A small working incision was made in the fourth intercostal space anterolaterally, this was approximately 5 cm in length. No rib spreading was performed during the procedure.  A second port type incision was made anterior to the first for instrumentation and retraction purposes.  The lung was inspected, as the lung was retracted posterolaterally, the simple cyst in the mediastinum was apparent, as had been seen on CT.  The pleural reflection was divided at the hilum beginning anteriorly and then extending down to the inferior pulmonary ligament which was divided. A level 9 lymph node was taken as a separate specimen.  As lymph nodes were encountered during the dissection and resected, they were all  labeled separately according to their anatomic station and sent for permanent pathology.  The pleural reflection was divided superiorly.  The posterior portion of the  pleural reflection was divided later in the case.  The fissure was relatively incomplete and initial attempts to dissect in the fissure directly showed that this was not going to be feasible and the fissure would have to be completed with staplers.  The superior and inferior pulmonary veins were dissected out.  Again nodes were sent as separate specimens.  An endoscopic GIA stapler was used to complete the portion of the fissure between the lingula and the left lower lobe, down to the hilum.  At this point, the visualization within the fissure was not sufficient to allow to be divided, so decision was made to proceed with dissecting out the pulmonary veins.  Before this was done, the mass was palpated and it was clear that trying to do a wedge resection of the mass would leave essentially no functional left upper lobe.  So the decision was made to proceed with a left upper lobectomy.  The superior pulmonary vein was dissected out and encircled and divided with an endoscopic vascular stapler.  The dissection then was carried to the pulmonary artery and its branches.  Large level 10 nodes were identified and sent as separate specimens.  These were otherwise benign-appearing, but were definitely enlarged.  The anterior and apical segmental arterial branches to the left upper lobe were dissected out.  These were both large vessels that were in close proximity.  After dissecting these vessels out, they were divided with an endoscopic vascular stapler. There was much smaller posterior branch which was divided separately, that could only be visualized once the anterior and apical branches had been divided.  At this point, attention was turned back to the fissure, it was completed with sequential firings of a stapler.  There was a single large lingular segmental arterial branch, which was carefully dissected out, encircled, and divided with the endoscopic vascular stapler.  This allowed the left  upper lobe bronchus to be encircled.  A stapler was placed across the bronchus, flush with its takeoff from the left main stem, and closed.  The test inflation showed good aeration of all segments of the lower lobe.  The stapler then was fired dividing the left upper lobe bronchus.  The left upper lobe was placed into an endoscopic retrieval bag and removed through the incision.  It was sent for frozen section of the mass and the margins. The mass was a non-small cell carcinoma, probably an adenocarcinoma.  The bronchial margin was clear.  Next attention was turned to the simple cyst in the mediastinum, this was dissected out and removed and sent for permanent pathology.  It was a thin walled simple cyst containing clear fluid.  Level 5 and 6 lymph nodes were dissected out. A level 7 lymph node was sampled as well. These were all sent as separate specimens.  The chest was filled with warm saline and a test inflation showed some leakage from the fissure.  ProGEL was applied to this area. There was no air leakage from the bronchial stump.  A final inspection was made for hemostasis.  An On-Q  local anesthetic catheter was placed through a separate stab wound posteriorly and tunneled into a subpleural location.  A 32-French Blake drain was placed and directed posteriorly and apically and a 28-French chest tube was directed anteriorly and apically.  The lower lobe was reinflated.  The utility incision was closed in 3 layers.  The patient was placed back into a supine position. He was extubated in the operating room and taken to the postanesthetic care unit in good condition.     Revonda Standard Roxan Hockey, M.D.     SCH/MEDQ  D:  11/24/2013  T:  11/25/2013  Job:  [id number]

## 2013-11-25 NOTE — Progress Notes (Signed)
Patient ID: Adam [name], male   DOB: 07/05/1929, 80 y.o.   MRN: [id number] EVENING ROUNDS NOTE :     [address]       [place] [address]             [phone]                 1 Day Post-Op Procedure(s) (LRB): VIDEO ASSISTED THORACOSCOPY (Left) LOBECTOMY (Left)  Total Length of Stay:  LOS: 1 day  BP 124/74  Pulse 60  Temp(Src) 97.9 F (36.6 C) (Oral)  Resp 22  Ht 5\' 7"  (1.702 m)  Wt 179 lb 0.2 oz (81.2 kg)  BMI 28.03 kg/m2  SpO2 95%  .Intake/Output     03/31 0701 - 04/01 0700   P.O.    I.V. (mL/kg) 930.3 (11.5)   IV Piggyback 50   Total Intake(mL/kg) 980.3 (12.1)   Urine (mL/kg/hr) 440 (0.4)   Blood    Chest Tube 440 (0.4)   Total Output 880   Net +100.3         . dextrose 5 % and 0.9% NaCl 50 mL/hr at 11/25/13 2100     Lab Results  Component Value Date   WBC 10.2 11/25/2013   HGB 14.1 11/25/2013   HCT 39.9 11/25/2013   PLT 132* 11/25/2013   GLUCOSE 126* 11/25/2013   ALT 21 11/20/2013   AST 24 11/20/2013   NA 140 11/25/2013   K 3.8 11/25/2013   CL 105 11/25/2013   CREATININE 0.66 11/25/2013   BUN 13 11/25/2013   CO2 23 11/25/2013   INR 1.01 11/20/2013   Walked around unit twice today  Grace Isaac MD  Beeper [phone] Office [phone] 11/25/2013 10:01 PM

## 2013-11-25 NOTE — Progress Notes (Signed)
Clinical Social Work Department BRIEF PSYCHOSOCIAL ASSESSMENT 11/25/2013  Patient:  Adam [name], Adam [name]     Account Number:  [id number]     Admit date:  11/24/2013  Clinical Social Worker:  Megan Salon  Date/Time:  11/25/2013 02:11 PM  Referred by:  Care Management  Date Referred:  11/25/2013 Referred for  SNF Placement   Other Referral:   Interview type:  Other - See comment Other interview type:   CSW spoke to patient's wife over the phone. Per CM, patient's wife requested to speak with Education officer, museum.    PSYCHOSOCIAL DATA Living Status:  FACILITY Admitted from facility:  [facility] Level of care:  Independent Living Primary support name:  Francine Graven Primary support relationship to patient:  SPOUSE Degree of support available:   good, supportive wife    CURRENT CONCERNS Current Concerns  Post-Acute Placement   Other Concerns:    SOCIAL WORK ASSESSMENT / PLAN Per CM, patient is from [facility], but patient's wife would like to speak with social worker regarding SNF at [facility]. CSW called patient's wife and explained reason for phone call. Patient's wife was receptive to speaking with Education officer, museum. Wife states that patient has already saved a spot in [facility]. CSW explained that PT needs to first come evaluate patient and recommend snf in order for insurance to cover rehab. Patient's wife states that she understands and is happy to whatever she can to help. CSW will continue to follow and awaits PT eval.   Assessment/plan status:  Psychosocial Support/Ongoing Assessment of Needs Other assessment/ plan:   Information/referral to community resources:   CSW contact information    PATIENT'S/FAMILY'S RESPONSE TO PLAN OF CARE: Patient's wife states that patient has already reserved a spot with [facility] section. CSW explained that social worker will be in contact with [facility] and will send them clinicals. Patient's wife  thanked Education officer, museum for follow up.        Jeanette Caprice, MSW, [place]

## 2013-11-26 ENCOUNTER — Inpatient Hospital Stay (HOSPITAL_COMMUNITY): Payer: Medicare Other

## 2013-11-26 LAB — CBC
HCT: 41.9 % (ref 39.0–52.0)
Hemoglobin: 14.8 g/dL (ref 13.0–17.0)
MCH: 33.6 pg (ref 26.0–34.0)
MCHC: 35.3 g/dL (ref 30.0–36.0)
MCV: 95.2 fL (ref 78.0–100.0)
PLATELETS: 138 10*3/uL — AB (ref 150–400)
RBC: 4.4 MIL/uL (ref 4.22–5.81)
RDW: 12.7 % (ref 11.5–15.5)
WBC: 11.9 10*3/uL — AB (ref 4.0–10.5)

## 2013-11-26 LAB — COMPREHENSIVE METABOLIC PANEL
ALK PHOS: 50 U/L (ref 39–117)
ALT: 14 U/L (ref 0–53)
AST: 17 U/L (ref 0–37)
Albumin: 2.9 g/dL — ABNORMAL LOW (ref 3.5–5.2)
BILIRUBIN TOTAL: 1.1 mg/dL (ref 0.3–1.2)
BUN: 11 mg/dL (ref 6–23)
CHLORIDE: 103 meq/L (ref 96–112)
CO2: 25 meq/L (ref 19–32)
CREATININE: 0.64 mg/dL (ref 0.50–1.35)
Calcium: 8.5 mg/dL (ref 8.4–10.5)
GFR calc Af Amer: >90 mL/min (ref >90)
GFR calc non Af Amer: 89 mL/min — ABNORMAL LOW (ref >90)
Glucose, Bld: 124 mg/dL — ABNORMAL HIGH (ref 70–99)
POTASSIUM: 4 meq/L (ref 3.7–5.3)
Sodium: 138 mEq/L (ref 137–147)
Total Protein: 5.7 g/dL — ABNORMAL LOW (ref 6.0–8.3)

## 2013-11-26 MED ORDER — FUROSEMIDE 20 MG PO TABS
20.0000 mg | ORAL_TABLET | Freq: Every day | ORAL | Status: DC
  Administered 2013-11-27 – 2013-11-29 (×3): 20 mg via ORAL
  Filled 2013-11-26 (×4): qty 1

## 2013-11-26 MED ORDER — SODIUM CHLORIDE 0.9 % IJ SOLN
10.0000 mL | Freq: Two times a day (BID) | INTRAMUSCULAR | Status: DC
  Administered 2013-11-26 – 2013-11-28 (×6): 10 mL via INTRAVENOUS

## 2013-11-26 MED ORDER — SODIUM CHLORIDE 0.9 % IJ SOLN: 10.0000 mL | INTRAMUSCULAR | Status: DC | PRN

## 2013-11-26 NOTE — Progress Notes (Signed)
[facility] BRIEF SICU PROGRESS NOTE  2 Days Post-Op  S/P Procedure(s) (LRB): VIDEO ASSISTED THORACOSCOPY (Left) LOBECTOMY (Left)   Stable day Still waiting for bed on step-down unit  Plan: Continue current plan  Jovany Disano H 11/26/2013 6:48 PM

## 2013-11-26 NOTE — Clinical Social Work Note (Signed)
Spoke with patient and advised him [facility] is agreeable to patient returning to the SNF rehab unit and using his respite days if he does not have [facility] SNF days. Will await further updates on when he will be ready for transfer- Eduard Clos, MSW, [place]

## 2013-11-26 NOTE — Progress Notes (Signed)
2 Days Post-Op Procedure(s) (LRB): VIDEO ASSISTED THORACOSCOPY (Left) LOBECTOMY (Left) Subjective: + incisional pain- hasn't used PCA this AM + flatus  Objective: Vital signs in last 24 hours: Temp:  [97.6 F (36.4 C)-98 F (36.7 C)] 97.6 F (36.4 C) (04/01 0734) Pulse Rate:  [57-97] 85 (04/01 0600) Cardiac Rhythm:  [-] Normal sinus rhythm (04/01 0600) Resp:  [17-29] 22 (04/01 0600) BP: (124-171)/(64-96) 124/75 mmHg (04/01 0600) SpO2:  [92 %-99 %] 93 % (04/01 0600) Weight:  [182 lb 5.1 oz (82.7 kg)] 182 lb 5.1 oz (82.7 kg) (04/01 0400)  Hemodynamic parameters for last 24 hours:    Intake/Output from previous day: 03/31 0701 - 04/01 0700 In: 1436.3 [I.V.:1386.3; IV Piggyback:50] Out: 2030 [Urine:1210; Chest Tube:820] Intake/Output this shift:    General appearance: alert and no distress Neurologic: intact Heart: regular rate and rhythm Lungs: diminished breath sounds left base Abdomen: normal findings: soft, non-tender  Lab Results:  Recent Labs  11/25/13 0410 11/26/13 0425  WBC 10.2 11.9*  HGB 14.1 14.8  HCT 39.9 41.9  PLT 132* 138*   BMET:  Recent Labs  11/25/13 0410 11/26/13 0425  NA 140 138  K 3.8 4.0  CL 105 103  CO2 23 25  GLUCOSE 126* 124*  BUN 13 11  CREATININE 0.66 0.64  CALCIUM 8.3* 8.5    PT/INR: No results found for this basename: LABPROT, INR,  in the last 72 hours ABG    Component Value Date/Time   PHART 7.393 11/25/2013 0409   HCO3 24.8* 11/25/2013 0409   TCO2 26.1 11/25/2013 0409   O2SAT 95.4 11/25/2013 0409   CBG (last 3)  No results found for this basename: GLUCAP,  in the last 72 hours  Assessment/Plan: S/P Procedure(s) (LRB): VIDEO ASSISTED THORACOSCOPY (Left) LOBECTOMY (Left) Plan for transfer to step-down: see transfer orders Overall doing well He still has an air leak but it is decreased Moderate drainage from CT CT to water seal Dc foley IV to KVO   LOS: 2 days    HENDRICKSON,STEVEN C 11/26/2013

## 2013-11-27 ENCOUNTER — Inpatient Hospital Stay (HOSPITAL_COMMUNITY): Payer: Medicare Other

## 2013-11-27 NOTE — Progress Notes (Signed)
3 Days Post-Op Procedure(s) (LRB): VIDEO ASSISTED THORACOSCOPY (Left) LOBECTOMY (Left) Subjective: No complaints  Objective: Vital signs in last 24 hours: Temp:  [97.3 F (36.3 C)-98.6 F (37 C)] 97.3 F (36.3 C) (04/02 0731) Pulse Rate:  [54-93] 80 (04/02 0700) Cardiac Rhythm:  [-] Normal sinus rhythm (04/02 0700) Resp:  [19-26] 20 (04/02 0746) BP: (127-163)/(69-104) 143/76 mmHg (04/02 0600) SpO2:  [92 %-98 %] 97 % (04/02 0746) FiO2 (%):  [21 %] 21 % (04/01 1600) Weight:  [179 lb 6.4 oz (81.375 kg)] 179 lb 6.4 oz (81.375 kg) (04/02 0400)  Hemodynamic parameters for last 24 hours:    Intake/Output from previous day: 04/01 0701 - 04/02 0700 In: 1060 [P.O.:480; I.V.:580] Out: 3035 [Urine:2700; Chest Tube:335] Intake/Output this shift:    General appearance: alert and no distress Neurologic: intact Heart: regular rate and rhythm Lungs: diminished breath sounds bibasilar  Lab Results:  Recent Labs  11/25/13 0410 11/26/13 0425  WBC 10.2 11.9*  HGB 14.1 14.8  HCT 39.9 41.9  PLT 132* 138*   BMET:  Recent Labs  11/25/13 0410 11/26/13 0425  NA 140 138  K 3.8 4.0  CL 105 103  CO2 23 25  GLUCOSE 126* 124*  BUN 13 11  CREATININE 0.66 0.64  CALCIUM 8.3* 8.5    PT/INR: No results found for this basename: LABPROT, INR,  in the last 72 hours ABG    Component Value Date/Time   PHART 7.393 11/25/2013 0409   HCO3 24.8* 11/25/2013 0409   TCO2 26.1 11/25/2013 0409   O2SAT 95.4 11/25/2013 0409   CBG (last 3)  No results found for this basename: GLUCAP,  in the last 72 hours  Assessment/Plan: S/P Procedure(s) (LRB): VIDEO ASSISTED THORACOSCOPY (Left) LOBECTOMY (Left) Plan for transfer to step-down: see transfer orders  No air leak- will dc anterior CT  Probably can dc posterior tube tomorrow  PATH- T1N1- Stage IIA- d/w patient will need to discuss chemo  Continue ambulation   LOS: 3 days    HENDRICKSON,STEVEN C 11/27/2013

## 2013-11-27 NOTE — Progress Notes (Signed)
Utilization review completed.  

## 2013-11-28 ENCOUNTER — Inpatient Hospital Stay (HOSPITAL_COMMUNITY): Payer: Medicare Other

## 2013-11-28 MED ORDER — POLYETHYLENE GLYCOL 3350 17 G PO PACK
17.0000 g | PACK | Freq: Once | ORAL | Status: AC
  Administered 2013-11-28: 17 g via ORAL
  Filled 2013-11-28: qty 1

## 2013-11-28 NOTE — Discharge Summary (Signed)
Physician Discharge Summary       [address]       [place] [address]             [phone]    Patient ID: Adam [name] MRN: [id number] DOB/AGE: 1928/09/30 80 y.o.  Admit date: 11/24/2013 Discharge date: 11/29/2013  Admission Diagnoses: 1. Left upper lobe mass 2. History of tobacco abuse 3. History of asbestos exposure  Discharge Diagnoses:  1. Left upper lobe mass (Non small cell cancer- Stage IIA) 2. History of tobacco abuse 3. History of asbestos exposure   Procedure (s):  Left video-assisted thoracoscopy, thoracoscopic left upper lobectomy, mediastinal lymph node dissection, resection of mediastinal cyst, On-Q local anesthetic catheter placement by Dr. Roxan Hockey on 11/24/2013.   Pathology: 1. Lung, resection (segmental or lobe), Left upper - INVASIVE WELL DIFFERENTIATED ADENOCARCINOMA SPANNING 2.4 CM IN GREATEST DIMENSION. - TUMOR DOES NOT INVOLVE PLEURA. - MARGINS ARE NEGATIVE. - ONE BENIGN SUBPLEURAL LYMPH NODE WITH NO TUMOR SEEN (0/1). - SEE ONCOLOGY TEMPLATE. 2. Lymph node, biopsy, Level 11 #1 LUL - ONE BENIGN LYMPH NODE WITH NO TUMOR SEEN (0/1). 3. Lymph node, biopsy, Level 9 LUL - ONE BENIGN LYMPH NODE WITH NO TUMOR SEEN (0/1). 4. Lymph node, biopsy, Level 11 #2 LUL - ONE BENIGN LYMPH NODE WITH NO TUMOR SEEN (0/1). 5. Lymph node, biopsy, Level 10 LUL - ONE BENIGN LYMPH NODE WITH NO TUMOR SEEN (0/1). 6. Lymph node, biopsy, Level 10 #2 LUL - ONE LYMPH NODE POSITIVE FOR METASTATIC ADENOCARCINOMA (1/1). 7. Lymph node, biopsy, Level 10 #3 LUL - ONE BENIGN LYMPH NODE WITH NO TUMOR SEEN (0/1). 8. Lymph node, biopsy, Level 11 #3 LUL - ONE BENIGN LYMPH NODE WITH NO TUMOR SEEN (0/1). 9. Lymph node, biopsy, Level 10 #4 LUL - ONE BENIGN LYMPH NODE WITH NO TUMOR SEEN (0/1). 10. Mediastinum, biopsy, cyst LUL - BENIGN MESOTHELIAL LINED CYST. - NO ATYPIA OR MALIGNANCY IDENTIFIED. 11. Lymph node, biopsy, Level 5 LUL - ONE BENIGN LYMPH NODE  WITH NO TUMOR SEEN (0/1). 12. Lymph node, biopsy, Level 5 #2 LUL - ONE BENIGN LYMPH NODE WITH NO TUMOR SEEN (0/1). 13. Lymph node, biopsy, Level 5 #3 LUL - ONE BENIGN LYMPH NODE WITH NO TUMOR SEEN (0/1). 14. Lymph node, biopsy, Level 6 LUL 1 of 4 FINAL for BOLIVAR, KORANDA ([id number]) Diagnosis(continued) - ONE BENIGN LYMPH NODE WITH NO TUMOR SEEN (0/1). 15. Lymph node, biopsy, Level 7 LUL - ONE BENIGN LYMPH NODE WITH NO TUMOR SEEN (0/1).  TNM Code: pT1b, pN1.    History of Presenting Illness: This is an 80 year old gentleman with a remote history of tobacco abuse and asbestos exposure. He smoked 2 packs a day for about 10 years but quit 45 years ago. He also had some exposure to asbestos. He worked in [employer].  He recently moved to [place] from [facility]. He had a productive cough about 2-3 weeks ago. He went to an urgent care to get that checked out. He was treated with antibiotics for bronchitis. They did recommend a chest x-ray. It showed a left upper lobe opacity. A CT of the chest was done which showed a 1.6 cm spiculated left upper lobe mass. He was referred to Dr. Burney Gauze. He did a PET/CT which showed the lesion was hypermetabolic with an SUV of 11.  He says he's lost a few pounds, 3 over the past 3 months and 5 over the past 6 months. He says he  does have decreased energy which he attributes to "old age." He does not have any chest pain, pressure, or tightness. He remains very physically active. He says that he would get short of breath if he ran 50 yards. But he can walk half a mile without shortness of breath. He also can get up 2 flights of stairs without stopping. He has not had any hemoptysis. He denies wheezing. Dr. Roxan Hockey discussed the need for a left VATS, possible wedge, possible left upper lobectomy. Potential risks, complications, and benefits of the surgery were discussed with the patient and he agreed to proceed with surgery.  PFTs were obtained and then he underwent left lung surgery on 11/24/2013.    Brief Hospital Course:  He remained afebrile and hemodynamically stable. A line and foley were removed early in his post operative course. Chest tubes were placed to water seal on 4/1. He was felt surgically stable for transfer from the ICU to 3300 for further convalescence on 11/27/2013. Anterior chest tube was removed on 4/2. Daily chest x rays remained stable. Remaining chest tube was removed 4/3. He is ambulating on room air. He is tolerating a diet. Provided he remains afebrile, chest x-ray remains stable, and pending morning round evaluation, he will be surgically stable for discharge on 11/29/2013.   Latest Vital Signs: Blood pressure 161/107, pulse 66, temperature 98.8 F (37.1 C), temperature source Oral, resp. rate 19, height 5\' 7"  (1.702 m), weight 179 lb 6.4 oz (81.375 kg), SpO2 95.00%.  Physical Exam: Alert, no distress  Diminished BS bilaterally  RRR nl S1, S2  Wound clean and dry  No air leak, serous drainage from CT   Discharge Condition:Stable   Recent laboratory studies:  Lab Results  Component Value Date   WBC 11.9* 11/26/2013   HGB 14.8 11/26/2013   HCT 41.9 11/26/2013   MCV 95.2 11/26/2013   PLT 138* 11/26/2013   Lab Results  Component Value Date   NA 138 11/26/2013   K 4.0 11/26/2013   CL 103 11/26/2013   CO2 25 11/26/2013   CREATININE 0.64 11/26/2013   GLUCOSE 124* 11/26/2013     Discharge Instructions: 1. Please obtain vital signs at least one time daily 2. Patient may shower and clean incisions with soap and water daily.  Wounds do not need to be covered. 3. Ambulate patient at least three times daily. 4. No driving, heavy lifting or strenuous activity. 5. May resume regular diet.     Diagnostic Studies: Dg Chest Port 1 View  11/28/2013   CLINICAL DATA:  Postop from left upper lobectomy.  Lung carcinoma.  EXAM: PORTABLE CHEST - 1 VIEW  COMPARISON:  11/27/2013  FINDINGS: Left chest tube and  right jugular central venous catheter remain in appropriate position. No pneumothorax identified. Decreased atelectasis in the lung bases.  IMPRESSION: Decrease bibasilar atelectasis.  No pneumothorax identified.   Electronically Signed   By: Earle Gell M.D.   On: 11/28/2013 08:04      Future Appointments Provider Department Dept Phone   01/30/2014 11:30 AM Gwendolyn Huson [phone]   01/30/2014 12:00 PM Volanda Napoleon, MD [place] [phone]      Discharge Medications:   Medication List         aspirin 81 MG tablet  Take 81 mg by mouth daily.     CALCIUM-VITAMIN D PO  Take 1 tablet by mouth daily.     Fish  Oil 1000 MG Caps  Take 1,000 mg by mouth daily.     furosemide 20 MG tablet  Commonly known as:  LASIX  Take 20 mg by mouth. Only takes as needed     losartan 50 MG tablet  Commonly known as:  COZAAR  Take 50 mg by mouth daily.     magnesium 30 MG tablet  Take 30 mg by mouth daily.     multivitamin with minerals Tabs tablet  Take 1 tablet by mouth daily.     traMADol 50 MG tablet  Commonly known as:  ULTRAM  Take 1-2 tablets (50-100 mg total) by mouth every 6 (six) hours as needed for moderate pain.         Follow Up Appointments:     Follow-up Information   Follow up with Dilan Novosad C, MD In 3 weeks. (PA/LAT CXR to be taken (at [place] which is in the same building as Dr. Leonarda Salon office) one hour prior to office appointment;Office will mail appointment date and time)    Specialty:  Cardiothoracic Surgery   Contact information:   [address] [place] [address] [phone]       Follow up with Margit Hanks, RN On 12/09/2013. (Nurse to remove chest tube sutures. Office will mail appointment time)    Contact information:   [place] [address] [phone]       Signed: Lars Pinks  MPA-C 11/28/2013, 9:04 AM

## 2013-11-28 NOTE — Progress Notes (Signed)
Removed chest tube per order. Small amount of subcutaneous air felt posterior to removal site. Pt in no distress.  O2 sats 95% in room air.

## 2013-11-28 NOTE — Progress Notes (Signed)
4 Days Post-Op Procedure(s) (LRB): VIDEO ASSISTED THORACOSCOPY (Left) LOBECTOMY (Left) Subjective: Feels well this AM Mild incisional discomfort No BM since surgery  Objective: Vital signs in last 24 hours: Temp:  [98.2 F (36.8 C)-98.8 F (37.1 C)] 98.8 F (37.1 C) (04/03 0700) Pulse Rate:  [53-106] 106 (04/03 0809) Cardiac Rhythm:  [-] Normal sinus rhythm (04/03 0809) Resp:  [18-27] 22 (04/03 0342) BP: (136-185)/(78-93) 185/93 mmHg (04/03 0809) SpO2:  [93 %-100 %] 95 % (04/03 0809)  Hemodynamic parameters for last 24 hours:    Intake/Output from previous day: 04/02 0701 - 04/03 0700 In: 100 [I.V.:100] Out: 700 [Urine:550; Chest Tube:150] Intake/Output this shift: Total I/O In: 400 [I.V.:400] Out: 120 [Chest Tube:120]  PE Alert, no distress Diminished BS bilaterally RRR nl S1, S2 Wound clean and dry No air leak, serous drainage from CT  Lab Results:  Recent Labs  11/26/13 0425  WBC 11.9*  HGB 14.8  HCT 41.9  PLT 138*   BMET:  Recent Labs  11/26/13 0425  NA 138  K 4.0  CL 103  CO2 25  GLUCOSE 124*  BUN 11  CREATININE 0.64  CALCIUM 8.5    PT/INR: No results found for this basename: LABPROT, INR,  in the last 72 hours ABG    Component Value Date/Time   PHART 7.393 11/25/2013 0409   HCO3 24.8* 11/25/2013 0409   TCO2 26.1 11/25/2013 0409   O2SAT 95.4 11/25/2013 0409   CBG (last 3)  No results found for this basename: GLUCAP,  in the last 72 hours  Assessment/Plan: S/P Procedure(s) (LRB): VIDEO ASSISTED THORACOSCOPY (Left) LOBECTOMY (Left) - Doing well Drainage down and no air leak- dc CT Miralax for constipation Hopefully can dc tomorrow- dc plan is to go to rehab [facility]   LOS: 4 days    [place]alohilani Maiorino C 11/28/2013

## 2013-11-28 NOTE — Progress Notes (Signed)
New dressing applied to chest tube removal site. Serosan.fluid present

## 2013-11-28 NOTE — Discharge Instructions (Signed)
Thoracoscopy Care After Refer to this sheet in the next few weeks. These discharge instructions provide you with general information on caring for yourself after you leave the hospital. Your caregiver may also give you specific instructions. Your treatment has been planned according to the most current medical practices available, but unavoidable complications sometimes occur. If you have any problems or questions after discharge, call your caregiver. HOME CARE INSTRUCTIONS   Remove the bandage (dressing) over your chest tube site as directed by your caregiver.  It is normal to be sore for a couple weeks following surgery. See your caregiver if this seems to be getting worse rather than better.  Only take over-the-counter or prescription medicines for pain, discomfort, or fever as directed by your caregiver. It is very important to take pain medicine when you need it so that you will cough and breathe deeply enough to clear mucus (phlegm) and expand your lungs.  If it hurts to cough, hold a pillow against your chest when you cough. This may help with the discomfort. In spite of the discomfort, cough frequently, as this helps protect against getting an infection in your lung (pneumonia).  Taking deep breaths keeps lungs inflated and protects against pneumonia. Most patients will go home with an incentive spirometer that encourages deep breathing.  You may resume a normal diet and activities as directed.  Use showers for bathing until you see your caregiver, or as instructed.  Change dressings if necessary or as directed.  Avoid lifting or driving until you are instructed otherwise.  Make an appointment to see your caregiver for stitch (suture) or staple removal when instructed.  Do not travel by airplane for 2 weeks after the chest tube is removed. SEEK MEDICAL CARE IF:   You are bleeding from your wounds.  You have redness, swelling, or increasing pain in the wounds.  Your heartbeat  feels irregular or very fast.  There is pus coming from your wounds.  There is a bad smell coming from the wound or dressing. SEEK IMMEDIATE MEDICAL CARE IF:   You have a fever.  You develop a rash.  You have difficulty breathing.  You develop any reaction or side effects to medicines given.  You develop lightheadedness or feel faint.  You develop shortness of breath or chest pain. MAKE SURE YOU:   Understand these instructions.  Will watch your condition.  Will get help right away if you are not doing well or get worse. Document Released: 03/03/2005 Document Revised: 11/06/2011 Document Reviewed: 02/01/2011 [facility] Patient Information 2014 [place], [place].

## 2013-11-29 ENCOUNTER — Inpatient Hospital Stay (HOSPITAL_COMMUNITY): Payer: Medicare Other

## 2013-11-29 MED ORDER — TRAMADOL HCL 50 MG PO TABS: 50.0000 mg | ORAL_TABLET | Freq: Four times a day (QID) | ORAL | Status: DC | PRN

## 2013-11-29 NOTE — Progress Notes (Addendum)
       [address]       [place] [address]             [phone]          5 Days Post-Op Procedure(s) (LRB): VIDEO ASSISTED THORACOSCOPY (Left) LOBECTOMY (Left)  Subjective: Feels well, still waiting to have x-ray.   Objective: Vital signs in last 24 hours: Patient Vitals for the past 24 hrs:  BP Temp Temp src Pulse Resp SpO2  11/29/13 0800 - - - - 18 99 %  11/29/13 0730 138/85 mmHg - - 67 21 93 %  11/29/13 0700 - 98.6 F (37 C) Oral - - -  11/29/13 0400 - - - - 22 98 %  11/29/13 0332 138/76 mmHg 99.2 F (37.3 C) Oral 72 26 96 %  11/29/13 0000 - - - - 18 97 %  11/28/13 2325 123/74 mmHg 98.9 F (37.2 C) Oral 78 26 94 %  11/28/13 2000 - - - - 22 98 %  11/28/13 1931 149/85 mmHg 98.9 F (37.2 C) Oral 91 28 97 %  11/28/13 1700 - 98.1 F (36.7 C) Oral - - -  11/28/13 1654 146/90 mmHg - - 105 24 97 %  11/28/13 1600 - - - - 18 100 %  11/28/13 1200 - 98.5 F (36.9 C) Oral - 26 98 %  11/28/13 1145 154/86 mmHg - - 102 28 95 %  11/28/13 0945 123/59 mmHg - - 97 24 95 %  11/28/13 0938 - - - - 24 96 %   Current Weight  11/27/13 179 lb 6.4 oz (81.375 kg)     Intake/Output from previous day: 04/03 0701 - 04/04 0700 In: 400 [I.V.:400] Out: 472 [Urine:351; Stool:1; Chest Tube:120]    PHYSICAL EXAM:  Heart: RRR Lungs: Clear Wound: Dressed and dry     Lab Results: CBC:No results found for this basename: WBC, HGB, HCT, PLT,  in the last 72 hours BMET: No results found for this basename: NA, K, CL, CO2, GLUCOSE, BUN, CREATININE, CALCIUM,  in the last 72 hours  PT/INR: No results found for this basename: LABPROT, INR,  in the last 72 hours    Assessment/Plan: S/P Procedure(s) (LRB): VIDEO ASSISTED THORACOSCOPY (Left) LOBECTOMY (Left) Patient stable, doing well. D/c central line, follow up chest x-ray. Plan d/c to SNF later today if x-ray stable.   LOS: 5 days    COLLINS,GINA H 11/29/2013  ADDENDUM:  Chest x-ray reviewed- stable subq  air, no obvious pneumothorax.  Will d/c to SNF today.  CXR reviewed and patient examined- ready for DC to SNF  patient examined and medical record reviewed,agree with above note. VAN TRIGT III,Shawnn Bouillon 11/29/2013

## 2013-11-29 NOTE — Progress Notes (Signed)
Patient is medically stable to D/C to [facility]. Per RN Candice at [facility] patient can come today. Clinical Education officer, museum (CSW) faxed D/C Summary to [facility], prepared D/C packet and arranged non-emergency EMS ([facility]) for transport. RN ar [place] reported that they do not have anybody that can come pick the patient up. Patient is aware of above and RN is aware of above. Please reconsult if further social work needs arise. CSW signing off.   Blima Rich, [place] [employer] CSW [phone]

## 2013-12-08 ENCOUNTER — Ambulatory Visit (INDEPENDENT_AMBULATORY_CARE_PROVIDER_SITE_OTHER): Payer: Self-pay | Admitting: *Deleted

## 2013-12-08 DIAGNOSIS — Z4802 Encounter for removal of sutures: Secondary | ICD-10-CM

## 2013-12-08 DIAGNOSIS — D381 Neoplasm of uncertain behavior of trachea, bronchus and lung: Secondary | ICD-10-CM

## 2013-12-08 DIAGNOSIS — C3410 Malignant neoplasm of upper lobe, unspecified bronchus or lung: Secondary | ICD-10-CM

## 2013-12-08 NOTE — Progress Notes (Signed)
Mr. Faythe Ghee had a L VATS, LULobectomy, mediastinal left node dissection and resection of mediastinal cyst on 11/25/13.  He returns today for suture removal of two previous chest tube sites which was done easily.  The mini thoracotomy site, as welll as,  the chest tube sites are all very well healed.  He relates only minimal discomfort. Appetite and bowels are o.k.  He will return with a cxr as scheduled.

## 2013-12-23 ENCOUNTER — Telehealth: Payer: Self-pay | Admitting: Hematology & Oncology

## 2013-12-23 ENCOUNTER — Ambulatory Visit
Admission: RE | Admit: 2013-12-23 | Discharge: 2013-12-23 | Disposition: A | Discharge status: Home / Self Care | Payer: Medicare Other | Source: Ambulatory Visit | Attending: Thoracic Surgery (Cardiothoracic Vascular Surgery) | Admitting: Thoracic Surgery (Cardiothoracic Vascular Surgery)

## 2013-12-23 ENCOUNTER — Ambulatory Visit (INDEPENDENT_AMBULATORY_CARE_PROVIDER_SITE_OTHER): Payer: Self-pay | Admitting: Thoracic Surgery (Cardiothoracic Vascular Surgery)

## 2013-12-23 ENCOUNTER — Encounter: Payer: Self-pay | Admitting: Thoracic Surgery (Cardiothoracic Vascular Surgery)

## 2013-12-23 ENCOUNTER — Other Ambulatory Visit: Payer: Self-pay

## 2013-12-23 VITALS — BP 130/72 | HR 66 | Resp 16 | Ht 68.0 in | Wt 176.0 lb

## 2013-12-23 DIAGNOSIS — Z902 Acquired absence of lung [part of]: Secondary | ICD-10-CM

## 2013-12-23 DIAGNOSIS — C3490 Malignant neoplasm of unspecified part of unspecified bronchus or lung: Secondary | ICD-10-CM

## 2013-12-23 DIAGNOSIS — Q341 Congenital cyst of mediastinum: Secondary | ICD-10-CM

## 2013-12-23 DIAGNOSIS — C3410 Malignant neoplasm of upper lobe, unspecified bronchus or lung: Secondary | ICD-10-CM

## 2013-12-23 DIAGNOSIS — Z9889 Other specified postprocedural states: Secondary | ICD-10-CM

## 2013-12-23 DIAGNOSIS — Z09 Encounter for follow-up examination after completed treatment for conditions other than malignant neoplasm: Secondary | ICD-10-CM

## 2013-12-23 DIAGNOSIS — Q348 Other specified congenital malformations of respiratory system: Secondary | ICD-10-CM

## 2013-12-23 NOTE — Progress Notes (Signed)
HPI:  Adam [name] returns today for scheduled postoperative followup visit.  He is an 80 year old gentleman who had a thoracoscopic left upper lobectomy for a non-small cell carcinoma on March 30. This lesion was a clinical stage IA but he did have a positive node and he is pathologic stage IIA (T1, N1).  He complains that he gets short of breath with exertion. This really varies from day-to-day and on some days he feels better than others. He has an occasional ache and pain but is not taking the Ultram a regular basis. He last took one about 4 days ago. He still used 8 or 9 pills since discharge.  Past Medical History  Diagnosis Date  . Chicken pox   . BC (bronchogenic carcinoma)   . Hypertension     takes Losartan daily  . Shortness of breath     with exertion  . History of bronchitis     early Feb 2015  . History of colon polyps       Current Outpatient Prescriptions  Medication Sig Dispense Refill  . aspirin 81 MG tablet Take 81 mg by mouth daily.      Marland Kitchen CALCIUM-VITAMIN D PO Take 1 tablet by mouth daily.       . furosemide (LASIX) 20 MG tablet Take 20 mg by mouth. Only takes as needed      . losartan (COZAAR) 50 MG tablet Take 50 mg by mouth daily.      . magnesium 30 MG tablet Take 30 mg by mouth daily.      . Multiple Vitamin (MULTIVITAMIN WITH MINERALS) TABS tablet Take 1 tablet by mouth daily.      . Omega-3 Fatty Acids (FISH OIL) 1000 MG CAPS Take 1,000 mg by mouth daily.      . traMADol (ULTRAM) 50 MG tablet Take 1-2 tablets (50-100 mg total) by mouth every 6 (six) hours as needed for moderate pain.  30 tablet  0   No current facility-administered medications for this visit.    Physical Exam BP 130/72  Pulse 66  Resp 16  Ht 5\' 8"  (1.727 m)  Wt 176 lb (79.833 kg)  BMI 26.77 kg/m2  SpO79 73% 80 year old gentleman in no acute distress Alert and oriented x3 with no focal deficits Lungs diminished breath sounds left base, otherwise clear Incisions healing  well Cardiac regular rate and rhythm normal S1 and S2  Diagnostic Tests: Chest x-ray 12/23/2013 COMPARISON: Chest x-ray of 11/29/2013  FINDINGS:  There has been an increase in a rounded opacity posteriorly in the  inferior left lower lung suspicious for loculated effusion. Focal  consolidation cannot be excluded. The right lung is clear. Heart  size is stable. Surgical sutures overlie the left hilum.  IMPRESSION:  Increasing opacity in the posterior medial left lung base may  represent loculated left effusion versus focal consolidation.  Impression: 80 year old gentleman who is now about a month post thoracoscopic left upper lobectomy for a stage IIA (T1, N1) non-small cell carcinoma. I think he's doing extremely well at this point in time. He is having minimal pain. His exercise tolerance is good. He does not to his standards, it really is remarkably good for someone his age this soon after the operation.  He has stage IIa disease so he will need adjuvant chemotherapy. He is followed by Dr. Burney Gauze and we will get him in to see Dr. Marin Olp as soon as we can.  Plan: Appointment with Dr. Marin Olp to discuss adjuvant chemotherapy  I will see him back in one month to check on his progress.

## 2013-12-23 NOTE — Telephone Encounter (Signed)
Sandy for Dr.Hendrickson's office called and cx 01/30/14 apt and resch apt for 12/30/13 due patient's wife having an apt with Dr.E on 12/30/13

## 2013-12-24 ENCOUNTER — Encounter (HOSPITAL_COMMUNITY): Payer: Self-pay

## 2013-12-24 DIAGNOSIS — C3410 Malignant neoplasm of upper lobe, unspecified bronchus or lung: Secondary | ICD-10-CM | POA: Insufficient documentation

## 2013-12-30 ENCOUNTER — Ambulatory Visit (HOSPITAL_BASED_OUTPATIENT_CLINIC_OR_DEPARTMENT_OTHER): Payer: Medicare Other | Admitting: Hematology & Oncology

## 2013-12-30 ENCOUNTER — Other Ambulatory Visit: Payer: Medicare Other | Admitting: Lab

## 2013-12-30 DIAGNOSIS — C771 Secondary and unspecified malignant neoplasm of intrathoracic lymph nodes: Secondary | ICD-10-CM

## 2013-12-30 DIAGNOSIS — C3410 Malignant neoplasm of upper lobe, unspecified bronchus or lung: Secondary | ICD-10-CM

## 2013-12-31 NOTE — Progress Notes (Signed)
Hematology and Oncology Follow Up Visit  Adam [name] [id number] [birth year] 80 y.o. 12/31/2013   Principle Diagnosis:   Stage IIA (T1bN1M0) adenocarcinoma of the left upper lung  Current Therapy:    Observation     Interim History:  [name] is back for followup. We did go ahead and work him up for this lung nodule that he have my first saw him. This was positive on PET scan. There is some activity in the mediastinum.  He'did undergo surgical resection. This was done on March 30. She had a left upper lobe resection. Lately, the pathology report shows that he has stage IIA adenocarcinoma. Had one positive lymph node in the hilum. His tumor measured 2.4 cm. All margins were negative. He had 14 lymph nodes overall resected.    He's recovered from his surgery pretty well. He has had some chest discomfort. He's trying to back to his normal level of activity. He and his wife want to go back to traveling.  Currently, his performance status is ECOG 1  Medications: Current outpatient prescriptions:aspirin 81 MG tablet, Take 81 mg by mouth daily., Disp: , Rfl: ;  CALCIUM-VITAMIN D PO, Take 1 tablet by mouth daily. , Disp: , Rfl: ;  furosemide (LASIX) 20 MG tablet, Take 20 mg by mouth. Only takes as needed, Disp: , Rfl: ;  losartan (COZAAR) 50 MG tablet, Take 50 mg by mouth daily., Disp: , Rfl: ;  magnesium 30 MG tablet, Take 30 mg by mouth daily., Disp: , Rfl:  Multiple Vitamin (MULTIVITAMIN WITH MINERALS) TABS tablet, Take 1 tablet by mouth daily., Disp: , Rfl: ;  Omega-3 Fatty Acids (FISH OIL) 1000 MG CAPS, Take 1,000 mg by mouth daily., Disp: , Rfl: ;  traMADol (ULTRAM) 50 MG tablet, Take 1-2 tablets (50-100 mg total) by mouth every 6 (six) hours as needed for moderate pain., Disp: 30 tablet, Rfl: 0  Allergies:  Allergies  Allergen Reactions  . Adhesive [Tape]     Skin Irritation    Past Medical History, Surgical history, Social history, and Family History were reviewed and  updated.  Review of Systems: As above  Physical Exam:  vitals were not taken for this visit.  Well-developed and well-nourished gentleman. Lungs are clear bilaterally. Cardiac exam is regular rate and rhythm. Abdomen soft. Has good bowel sounds. There is no fluid wave. There is no palpable liver or spleen tip. Exam shows a thoracoscopy scar in the left lateral chest wall. No erythema is noted. No tenderness is noted. Extremities shows no clubbing cyanosis or edema. Has good range of motion of his joints. Neurological exam shows no focal neurological deficits.  Lab Results  Component Value Date   WBC 11.9* 11/26/2013   HGB 14.8 11/26/2013   HCT 41.9 11/26/2013   MCV 95.2 11/26/2013   PLT 138* 11/26/2013     Chemistry      Component Value Date/Time   NA 138 11/26/2013 0425   K 4.0 11/26/2013 0425   CL 103 11/26/2013 0425   CO2 25 11/26/2013 0425   BUN 11 11/26/2013 0425   CREATININE 0.64 11/26/2013 0425      Component Value Date/Time   CALCIUM 8.5 11/26/2013 0425   ALKPHOS 50 11/26/2013 0425   AST 17 11/26/2013 0425   ALT 14 11/26/2013 0425   BILITOT 1.1 11/26/2013 0425         Impression and Plan: Adam [name] is 80 year old gentleman with stage IIA adenocarcinoma the left lung. Had one positive  lymph node.  I really don't see that adjuvant chemotherapy is really going to be a back rate of benefit to him. Is 80 years old. He does have some other health issues. He wants to be able to travel and be active. He understands that the risk of recurrence probably is going to be 40-50%.  He just does not want to have his quality of life compromised by chemotherapy.  I totally understand this. I agree with him. I strongly do appreciate that quality of life is very important.  We will follow him along with scans. I will do another CT scan in July. We will coordinate his scans and his wife scans together.  I spent a good 45 is with him today.  I will plan to see him back a week after his CAT scans are done  in July   Volanda Napoleon, MD 5/6/20153:17 PM

## 2014-01-26 ENCOUNTER — Other Ambulatory Visit: Payer: Self-pay | Admitting: Thoracic Surgery (Cardiothoracic Vascular Surgery)

## 2014-01-26 DIAGNOSIS — C3490 Malignant neoplasm of unspecified part of unspecified bronchus or lung: Secondary | ICD-10-CM

## 2014-01-27 ENCOUNTER — Ambulatory Visit (INDEPENDENT_AMBULATORY_CARE_PROVIDER_SITE_OTHER): Payer: Self-pay | Admitting: Thoracic Surgery (Cardiothoracic Vascular Surgery)

## 2014-01-27 ENCOUNTER — Encounter: Payer: Self-pay | Admitting: Thoracic Surgery (Cardiothoracic Vascular Surgery)

## 2014-01-27 ENCOUNTER — Ambulatory Visit
Admission: RE | Admit: 2014-01-27 | Discharge: 2014-01-27 | Disposition: A | Discharge status: Home / Self Care | Payer: Medicare Other | Source: Ambulatory Visit | Attending: Thoracic Surgery (Cardiothoracic Vascular Surgery) | Admitting: Thoracic Surgery (Cardiothoracic Vascular Surgery)

## 2014-01-27 VITALS — BP 136/82 | HR 76 | Resp 16 | Ht 68.0 in | Wt 176.0 lb

## 2014-01-27 DIAGNOSIS — C3410 Malignant neoplasm of upper lobe, unspecified bronchus or lung: Secondary | ICD-10-CM

## 2014-01-27 DIAGNOSIS — Z9889 Other specified postprocedural states: Secondary | ICD-10-CM

## 2014-01-27 DIAGNOSIS — C3490 Malignant neoplasm of unspecified part of unspecified bronchus or lung: Secondary | ICD-10-CM

## 2014-01-27 DIAGNOSIS — Z902 Acquired absence of lung [part of]: Secondary | ICD-10-CM

## 2014-01-27 NOTE — Progress Notes (Signed)
  HPI:  [name] returns today for a scheduled 3 month followup visit.  He is an 80 year old gentleman who underwent a thoracoscopic left upper lobectomy on March 30. His tumor turned out to be a T1 N1, stage IIa, non-small cell carcinoma. He discussed adjuvant chemotherapy with Dr. Marin Olp, but opted not to take chemotherapy.  He says that he still gets short of breath at times. He says that he'll be sitting watching television or reading and will feel like he is hyperventilating. He also gets some shortness of breath with exertion but it is the events at rest but her most concerning to him. His wife does not think he is eating well. He is working out 3 times a week, but feels exhausted afterwards.  Past Medical History  Diagnosis Date  . Chicken pox   . BC (bronchogenic carcinoma)   . Hypertension     takes Losartan daily  . Shortness of breath     with exertion  . History of bronchitis     early Feb 2015  . History of colon polyps    lung cancer-stage II a (T1, N1)-status post left upper lobectomy 11/24/2013    Current Outpatient Prescriptions  Medication Sig Dispense Refill  . aspirin 81 MG tablet Take 81 mg by mouth daily.      Marland Kitchen CALCIUM-VITAMIN D PO Take 1 tablet by mouth daily.       . furosemide (LASIX) 20 MG tablet Take 20 mg by mouth. Only takes as needed      . losartan (COZAAR) 50 MG tablet Take 50 mg by mouth daily.      . magnesium 30 MG tablet Take 30 mg by mouth daily.      . Multiple Vitamin (MULTIVITAMIN WITH MINERALS) TABS tablet Take 1 tablet by mouth daily.      . Omega-3 Fatty Acids (FISH OIL) 1000 MG CAPS Take 1,000 mg by mouth daily.      . traMADol (ULTRAM) 50 MG tablet Take 1-2 tablets (50-100 mg total) by mouth every 6 (six) hours as needed for moderate pain.  30 tablet  0   No current facility-administered medications for this visit.    Physical Exam BP 136/82  Pulse 76  Resp 16  Ht 5\' 8"  (1.727 m)  Wt 176 lb (79.833 kg)  BMI 26.77 kg/m2   SpO47 8% 80 year old man in no acute distress Alert and oriented x3 with no focal deficits No cervical or subclavicular adenopathy Incisions well healed Lungs clear with slightly diminished breath sounds at left base  Diagnostic Tests: Chest x-ray pending  Impression: 80 year old gentleman who is now about 2-1/2 months post thoracoscopic left upper lobectomy for a T1 N1 stage II a non-small cell carcinoma. Overall he is doing well. I don't think the episodes of shortness of breath at rest or related to the amount of lung resected per se. They may be exacerbated by to some degree. Based on his preoperative pulmonary function he should be able to engage in most activities without excessive shortness of breath. Some of this may be related to general recovery from surgery, and should improve with time.  He sees Dr. Marin Olp in July and will have a CT of the chest at that time.  Plan: I will see him back in 3 months to check on his progress

## 2014-01-30 ENCOUNTER — Ambulatory Visit: Payer: Medicare Other | Admitting: Hematology & Oncology

## 2014-01-30 ENCOUNTER — Other Ambulatory Visit: Payer: Medicare Other | Admitting: Lab

## 2014-02-26 ENCOUNTER — Encounter (HOSPITAL_BASED_OUTPATIENT_CLINIC_OR_DEPARTMENT_OTHER): Payer: Self-pay

## 2014-02-26 ENCOUNTER — Ambulatory Visit (HOSPITAL_BASED_OUTPATIENT_CLINIC_OR_DEPARTMENT_OTHER)
Admission: RE | Admit: 2014-02-26 | Discharge: 2014-02-26 | Disposition: A | Discharge status: Home / Self Care | Payer: Medicare Other | Source: Ambulatory Visit | Attending: Hematology & Oncology | Admitting: Hematology & Oncology

## 2014-02-26 ENCOUNTER — Other Ambulatory Visit (HOSPITAL_BASED_OUTPATIENT_CLINIC_OR_DEPARTMENT_OTHER): Payer: Medicare Other | Admitting: Lab

## 2014-02-26 DIAGNOSIS — C771 Secondary and unspecified malignant neoplasm of intrathoracic lymph nodes: Secondary | ICD-10-CM

## 2014-02-26 DIAGNOSIS — J90 Pleural effusion, not elsewhere classified: Secondary | ICD-10-CM | POA: Insufficient documentation

## 2014-02-26 DIAGNOSIS — C3490 Malignant neoplasm of unspecified part of unspecified bronchus or lung: Secondary | ICD-10-CM | POA: Insufficient documentation

## 2014-02-26 DIAGNOSIS — C3410 Malignant neoplasm of upper lobe, unspecified bronchus or lung: Secondary | ICD-10-CM

## 2014-02-26 DIAGNOSIS — Z902 Acquired absence of lung [part of]: Secondary | ICD-10-CM | POA: Insufficient documentation

## 2014-02-26 LAB — CBC WITH DIFFERENTIAL (CANCER CENTER ONLY)
BASO#: 0 10*3/uL (ref 0.0–0.2)
BASO%: 0.3 % (ref 0.0–2.0)
EOS%: 3.1 % (ref 0.0–7.0)
Eosinophils Absolute: 0.2 10*3/uL (ref 0.0–0.5)
HEMATOCRIT: 46.5 % (ref 38.7–49.9)
HEMOGLOBIN: 16.7 g/dL (ref 13.0–17.1)
LYMPH#: 2.3 10*3/uL (ref 0.9–3.3)
LYMPH%: 32.7 % (ref 14.0–48.0)
MCH: 32.5 pg (ref 28.0–33.4)
MCHC: 35.9 g/dL (ref 32.0–35.9)
MCV: 91 fL (ref 82–98)
MONO#: 0.6 10*3/uL (ref 0.1–0.9)
MONO%: 8.8 % (ref 0.0–13.0)
NEUT#: 3.9 10*3/uL (ref 1.5–6.5)
NEUT%: 55.1 % (ref 40.0–80.0)
Platelets: 176 10*3/uL (ref 145–400)
RBC: 5.14 10*6/uL (ref 4.20–5.70)
RDW: 12.5 % (ref 11.1–15.7)
WBC: 7 10*3/uL (ref 4.0–10.0)

## 2014-02-26 LAB — CMP (CANCER CENTER ONLY)
ALBUMIN: 3.4 g/dL (ref 3.3–5.5)
ALT(SGPT): 21 U/L (ref 10–47)
AST: 28 U/L (ref 11–38)
Alkaline Phosphatase: 59 U/L (ref 26–84)
BUN: 14 mg/dL (ref 7–22)
CALCIUM: 9 mg/dL (ref 8.0–10.3)
CHLORIDE: 100 meq/L (ref 98–108)
CO2: 28 mEq/L (ref 18–33)
CREATININE: 0.7 mg/dL (ref 0.6–1.2)
Glucose, Bld: 112 mg/dL (ref 73–118)
Potassium: 4.1 mEq/L (ref 3.3–4.7)
Sodium: 142 mEq/L (ref 128–145)
Total Bilirubin: 0.8 mg/dl (ref 0.20–1.60)
Total Protein: 6.6 g/dL (ref 6.4–8.1)

## 2014-02-26 MED ORDER — IOHEXOL 300 MG/ML  SOLN
80.0000 mL | Freq: Once | INTRAMUSCULAR | Status: AC | PRN
  Administered 2014-02-26: 80 mL via INTRAVENOUS

## 2014-03-02 ENCOUNTER — Telehealth: Payer: Self-pay | Admitting: Nurse Practitioner

## 2014-03-02 NOTE — Telephone Encounter (Addendum)
Message copied by Jimmy Footman on Mon Mar 02, 2014  9:35 AM ------      Message from: Burney Gauze R      Created: Thu Feb 26, 2014  6:41 PM       Call - NO lung cancer recurrence!! Laurey Arrow ------Pt verbalized understanding and appreciation.

## 2014-03-03 DIAGNOSIS — I493 Ventricular premature depolarization: Secondary | ICD-10-CM | POA: Insufficient documentation

## 2014-03-03 DIAGNOSIS — N529 Male erectile dysfunction, unspecified: Secondary | ICD-10-CM | POA: Insufficient documentation

## 2014-03-03 DIAGNOSIS — I491 Atrial premature depolarization: Secondary | ICD-10-CM | POA: Insufficient documentation

## 2014-03-03 DIAGNOSIS — M810 Age-related osteoporosis without current pathological fracture: Secondary | ICD-10-CM | POA: Insufficient documentation

## 2014-03-03 DIAGNOSIS — J449 Chronic obstructive pulmonary disease, unspecified: Secondary | ICD-10-CM | POA: Insufficient documentation

## 2014-03-03 DIAGNOSIS — I10 Essential (primary) hypertension: Secondary | ICD-10-CM | POA: Insufficient documentation

## 2014-03-03 DIAGNOSIS — I739 Peripheral vascular disease, unspecified: Secondary | ICD-10-CM | POA: Insufficient documentation

## 2014-03-05 ENCOUNTER — Ambulatory Visit (HOSPITAL_BASED_OUTPATIENT_CLINIC_OR_DEPARTMENT_OTHER): Payer: Medicare Other | Admitting: Family

## 2014-03-05 ENCOUNTER — Encounter: Payer: Self-pay | Admitting: Family

## 2014-03-05 VITALS — BP 162/80 | HR 78 | Temp 98.0°F | Resp 18 | Ht 68.0 in | Wt 179.0 lb

## 2014-03-05 DIAGNOSIS — C3490 Malignant neoplasm of unspecified part of unspecified bronchus or lung: Secondary | ICD-10-CM | POA: Diagnosis not present

## 2014-03-05 DIAGNOSIS — Z902 Acquired absence of lung [part of]: Secondary | ICD-10-CM

## 2014-03-05 DIAGNOSIS — Z9889 Other specified postprocedural states: Secondary | ICD-10-CM | POA: Diagnosis not present

## 2014-03-05 NOTE — Progress Notes (Signed)
[facility]  [phone]) [phone] [phone]) [phone]  ID: USTIN CRUICKSHANK OB: 05-17-1929 MR#: [id number] URK#:[id number] Patient Care Team: Liam Graham, DO as PCP - General (Family Medicine)  DIAGNOSIS: Stage IIA [phone]) adenocarcinoma of the left upper lung  INTERVAL HISTORY: Mr. Schaumburg is a very pleasant 80 yo male back today with his wife for a 2 month follow-up. We did go ahead and work him up for this lung nodule that he have my first saw him. This was positive on PET scan. There is some activity in the mediastinum. He did undergo surgical resection on November 24, 2013. He had a left upper lobe resection. Lately, the pathology report shows that he has stage IIA adenocarcinoma. Had one positive lymph node in the hilum. His tumor measured 2.4 cm. All margins were negative. He had 14 lymph nodes overall resected.  He has recovered from his surgery very well. He's trying to back to his normal level of activity. He and his wife want to go back to traveling. He states that he hasn''t got much energy but that he makes himself stay active with the help of his wife. He has been doing some walking and going to the gym 3 days a week. He does states that he feels a little panicked at times, when his sinuses get stuffy, like he isn't getting enough air but when he checks his O2 SAT it is always at or around 97%. He feels that this is a mental thing and that it will get better with time and exercise. He denies fever, chills, N/V, headache, dizziness, blurred vision, chest pain, palpitations, constipation, diarrhea, problems urinating, blood in urine or stool. He denies any swelling, tenderness, numbness or tingling in his extremities. He states that his appetite is good.   CURRENT TREATMENT: Observation  REVIEW OF SYSTEMS: All other 10 point review of systems is negative.   PAST MEDICAL HISTORY: Past Medical History  Diagnosis Date  . Chicken pox   . BC (bronchogenic carcinoma)   .  Hypertension     takes Losartan daily  . Shortness of breath     with exertion  . History of bronchitis     early Feb 2015  . History of colon polyps    PAST SURGICAL HISTORY: Past Surgical History  Procedure Laterality Date  . Umbilical hernia repair    . Wisdom tooth extraction    . Circumcision      at age 25  . Tonsillectomy      as a child  . Colonoscopy    . Cataract surgery Bilateral   . Video assisted thoracoscopy Left 11/24/2013    Procedure: VIDEO ASSISTED THORACOSCOPY;  Surgeon: Melrose Nakayama, MD;  Location: [place];  Service: Thoracic;  Laterality: Left;  . Lobectomy Left 11/24/2013    Procedure: LOBECTOMY;  Surgeon: Melrose Nakayama, MD;  Location: [facility] OR;  Service: Thoracic;  Laterality: Left;  POSSIBLE LUL LOBECTOMY   FAMILY HISTORY Family History  Problem Relation Age of Onset  . Hypertension Father     Deceased  . Stroke Father 71  . Ulcers Father   . Alcoholism Father   . Hyperlipidemia Father   . Glaucoma Mother 79    Deceased  . Dementia Sister   . Breast cancer Sister   . Healthy Brother   . Breast cancer Daughter   . Thyroid cancer Daughter    GYNECOLOGIC HISTORY:  No LMP for male patient.   SOCIAL HISTORY:  History  Social History  . Marital Status: Married    Spouse Name: N/A    Number of Children: N/A  . Years of Education: N/A   Occupational History  . Not on file.   Social History Main Topics  . Smoking status: Former Smoker -- 2.00 packs/day for 12 years    Types: Cigarettes    Start date: 12/15/1955    Quit date: 03/05/1974  . Smokeless tobacco: Never Used     Comment: quit 45 years ago  . Alcohol Use: 0.0 oz/week     Comment: daily wine  . Drug Use: No  . Sexual Activity: Not on file   Other Topics Concern  . Not on file   Social History Narrative  . No narrative on file   ADVANCED DIRECTIVES: <no information>  HEALTH MAINTENANCE: History  Substance Use Topics  . Smoking status: Former Smoker -- 2.00  packs/day for 12 years    Types: Cigarettes    Start date: 12/15/1955    Quit date: 03/05/1974  . Smokeless tobacco: Never Used     Comment: quit 45 years ago  . Alcohol Use: 0.0 oz/week     Comment: daily wine   Colonoscopy: PAP: Bone density: Lipid panel:  Allergies  Allergen Reactions  . Adhesive [Tape]     Skin Irritation   Current Outpatient Prescriptions  Medication Sig Dispense Refill  . aspirin 81 MG tablet Take 81 mg by mouth daily.      Marland Kitchen CALCIUM-VITAMIN D PO Take 1 tablet by mouth daily.       . Cyanocobalamin (VITAMIN B 12 PO) Take by mouth every morning.      . furosemide (LASIX) 20 MG tablet Take 20 mg by mouth. Only takes as needed      . losartan (COZAAR) 50 MG tablet Take 50 mg by mouth daily.      . magnesium 30 MG tablet Take 30 mg by mouth daily.      . Multiple Vitamin (MULTIVITAMIN WITH MINERALS) TABS tablet Take 1 tablet by mouth daily.      . Omega-3 Fatty Acids (FISH OIL) 1000 MG CAPS Take 1,000 mg by mouth daily.      . traMADol (ULTRAM) 50 MG tablet Take 1-2 tablets (50-100 mg total) by mouth every 6 (six) hours as needed for moderate pain.  30 tablet  0   No current facility-administered medications for this visit.   OBJECTIVE: Filed Vitals:   03/05/14 1024  BP: 162/80  Pulse: 78  Temp: 98 F (36.7 C)  Resp: 18   Body mass index is 27.22 kg/(m^2). ECOG FS:0 - Asymptomatic Ocular: Sclerae unicteric, pupils equal, round and reactive to light Ear-nose-throat: Oropharynx clear, dentition fair Lymphatic: No cervical or supraclavicular adenopathy Lungs no rales or rhonchi, good excursion bilaterally Heart regular rate and rhythm, no murmur appreciated Abd soft, nontender, positive bowel sounds MSK no focal spinal tenderness, no joint edema Neuro: non-focal, well-oriented, appropriate affect  LAB RESULTS: CMP     Component Value Date/Time   NA 142 02/26/2014 0919   NA 138 11/26/2013 0425   K 4.1 02/26/2014 0919   K 4.0 11/26/2013 0425   CL 100  02/26/2014 0919   CL 103 11/26/2013 0425   CO2 28 02/26/2014 0919   CO2 25 11/26/2013 0425   GLUCOSE 112 02/26/2014 0919   GLUCOSE 124* 11/26/2013 0425   BUN 14 02/26/2014 0919   BUN 11 11/26/2013 0425   CREATININE 0.7 02/26/2014 0919   CREATININE 0.64  11/26/2013 0425   CALCIUM 9.0 02/26/2014 0919   CALCIUM 8.5 11/26/2013 0425   PROT 6.6 02/26/2014 0919   PROT 5.7* 11/26/2013 0425   ALBUMIN 2.9* 11/26/2013 0425   AST 28 02/26/2014 0919   AST 17 11/26/2013 0425   ALT 21 02/26/2014 0919   ALT 14 11/26/2013 0425   ALKPHOS 59 02/26/2014 0919   ALKPHOS 50 11/26/2013 0425   BILITOT 0.80 02/26/2014 0919   BILITOT 1.1 11/26/2013 0425   GFRNONAA 89* 11/26/2013 0425   GFRAA >90 11/26/2013 0425   No results found for this basename: SPEP, UPEP,  kappa and lambda light chains   Lab Results  Component Value Date   WBC 7.0 02/26/2014   NEUTROABS 3.9 02/26/2014   HGB 16.7 02/26/2014   HCT 46.5 02/26/2014   MCV 91 02/26/2014   PLT 176 02/26/2014   No results found for this basename: LABCA2   No components found with this basename: [id number]   No results found for this basename: INR,  in the last 168 hours  STUDIES: Ct Chest W Contrast  02/26/2014   CLINICAL DATA:  Followup left lung carcinoma. Status post left upper lobectomy.  EXAM: CT CHEST WITH CONTRAST  TECHNIQUE: Multidetector CT imaging of the chest was performed during intravenous contrast administration.  CONTRAST:  64mL OMNIPAQUE IOHEXOL 300 MG/ML  SOLN  COMPARISON:  10/03/2013  FINDINGS: Postop changes are seen from interval left upper lobectomy. Small left pleural effusion versus pleural thickening noted. A few scattered tiny sub 5 mm right lung pulmonary nodules remain stable. No new or enlarging pulmonary nodules or masses are seen in either lung.  No evidence of hilar or mediastinal lymphadenopathy. No adenopathy seen elsewhere within the thorax. No evidence of chest wall mass or suspicious bone lesions. Both adrenal glands are normal in appearance.  IMPRESSION: Postop changes from left  upper lobectomy. Small left pleural effusion versus pleural thickening.  No evidence of residual or metastatic carcinoma within the thorax.   Electronically Signed   By: Earle Gell M.D.   On: 02/26/2014 12:43    ASSESSMENT/PLAN: Mr. Mathey is very pleasant 80 year old gentleman with stage IIA adenocarcinoma the left lung and one positive lymph node. He does have some other health issues. He wants to be able to travel and be active. He understands that the risk of recurrence probably is going to be 40-50%.  He just does not want to have his quality of life compromised by chemotherapy. He is doing well at this time.   We discussed his labs and chest CT in detail. All of which were negative.   We will continue to follow him closely and repeat his chest CT in 4 months, the morning before his follow-up.   We will see him again in 4 months for CT scan, labs and follow-up appointment.   All questions were answered. We discussed the importance of exercise and using his incentive spirometer. He also stated that he will try using a saline spay to help with his nasal congestion.   He is in agreement with the plan. He knows to call here with any questions or concerns. We can certainly see him sooner if need be.   Eliezer Bottom, NP 03/05/2014 11:22 AM

## 2014-03-23 DIAGNOSIS — R05 Cough: Secondary | ICD-10-CM | POA: Insufficient documentation

## 2014-03-23 DIAGNOSIS — R059 Cough, unspecified: Secondary | ICD-10-CM | POA: Insufficient documentation

## 2014-04-29 ENCOUNTER — Ambulatory Visit (INDEPENDENT_AMBULATORY_CARE_PROVIDER_SITE_OTHER)
Admission: RE | Admit: 2014-04-29 | Discharge: 2014-04-29 | Disposition: A | Discharge status: Home / Self Care | Payer: Medicare Other | Source: Ambulatory Visit | Attending: Internal Medicine | Admitting: Internal Medicine

## 2014-04-29 ENCOUNTER — Ambulatory Visit (INDEPENDENT_AMBULATORY_CARE_PROVIDER_SITE_OTHER): Payer: Medicare Other | Admitting: Internal Medicine

## 2014-04-29 ENCOUNTER — Encounter: Payer: Self-pay | Admitting: Internal Medicine

## 2014-04-29 VITALS — BP 106/70 | HR 79 | Temp 98.8°F | Ht 67.0 in | Wt 181.0 lb

## 2014-04-29 DIAGNOSIS — R0989 Other specified symptoms and signs involving the circulatory and respiratory systems: Secondary | ICD-10-CM

## 2014-04-29 DIAGNOSIS — R0609 Other forms of dyspnea: Secondary | ICD-10-CM

## 2014-04-29 DIAGNOSIS — R0600 Dyspnea, unspecified: Secondary | ICD-10-CM

## 2014-04-29 NOTE — Progress Notes (Signed)
Subjective:    Patient ID: Adam [name], male    DOB: 1929-03-01     MRN: [id number]  HPI  34 yowm quit smoking 1975 started with cough Feb 2015 > dx with [place] followed by [facility]/ [facility] but no chemo/RT to date.   Admit date: 11/24/2013  Discharge date: 11/29/2013  Admission Diagnoses:  1. Left upper lobe mass  2. History of tobacco abuse  3. History of asbestos exposure  Discharge Diagnoses:  1. 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 History of tobacco abuse  3. History of asbestos exposure  Procedure (s):  Left video-assisted thoracoscopy, thoracoscopic left upper lobectomy, mediastinal lymph node dissection, resection of mediastinal cyst, On-Q local anesthetic catheter placement by Dr. Roxan Hockey on 11/24/2013.   04/29/2014 1st [facility] office visit/ Adam [name]  Chief Complaint  Patient presents with  . Pulmonary Consult    Referred per [facility]. Pt c/o dyspnea x 6 months- with or without any exertion. He wakes up sometimes "breathing fast" and gets SOB after walking approx 2 minutes.   onset of sob late spring /early summer 2015 gradual onset somewhat progressive followed by [facility] at [facility].  Walking slt incline one eighth 3-4 x weekly sob but doesn't stop No problem supine Has not tried inhalers  No   day to day or daytime variabilty or assoc chronic cough/ excess or purulent mucus or cp or chest tightness, subjective wheeze overt sinus or hb symptoms. No unusual exp hx or h/o childhood pna/ asthma or knowledge of premature birth.  Sleeping ok without nocturnal  or early am exacerbation  of respiratory  c/o's or need for noct saba. Also denies any obvious fluctuation of symptoms with weather or environmental changes or other aggravating or alleviating factors except as outlined above   Current Medications, Allergies, Complete Past Medical History, Past Surgical History, Family History, and Social History were reviewed in  [employer] record.                 Review of Systems  Constitutional: Negative for fever, chills, activity change, appetite change and unexpected weight change.  HENT: Negative for congestion, dental problem, postnasal drip, rhinorrhea, sneezing, sore throat, trouble swallowing and voice change.   Eyes: Negative for visual disturbance.  Respiratory: Positive for cough and shortness of breath. Negative for choking.   Cardiovascular: Negative for chest pain and leg swelling.  Gastrointestinal: Negative for nausea, vomiting and abdominal pain.  Genitourinary: Negative for difficulty urinating.  Musculoskeletal: Negative for arthralgias.  Skin: Negative for rash.  Psychiatric/Behavioral: Negative for behavioral problems and confusion.       Objective:   Physical Exam amb wm nad Wt Readings from Last 3 Encounters:  04/29/14 181 lb (82.101 kg)  03/05/14 179 lb (81.194 kg)  01/27/14 176 lb (79.833 kg)      HEENT mild turbinate edema.  Oropharynx no thrush or excess pnd or cobblestoning.  No JVD or cervical adenopathy. Mild accessory muscle hypertrophy. Trachea midline, nl thryroid. Chest was hyperinflated by percussion with diminished breath sounds and moderate increased exp time without wheeze. Hoover sign positive at mid inspiration. Regular rate and rhythm without murmur gallop or rub or increase P2 or edema.  Abd: no hsm, nl excursion. Ext warm without cyanosis or clubbing.     CT chest 02/26/14  Postop changes from left upper lobectomy. Small left pleural  effusion versus pleural thickening.  No evidence of residual or metastatic carcinoma  within the thorax.   cxr 04/29/14 Aeration of the left lung base has improved in the interval.  Postoperative changes on the left are stable. The right lung is  clear. Heart size is stable. No bony abnormality is seen.  Lab Results  Component Value Date   WBC 7.0 02/26/2014   HGB 16.7 02/26/2014   HCT 46.5 02/26/2014     MCV 91 02/26/2014   PLT 176 02/26/2014       Chemistry      Component Value Date/Time   NA 142 02/26/2014 0919   NA 138 11/26/2013 0425   K 4.1 02/26/2014 0919   K 4.0 11/26/2013 0425   CL 100 02/26/2014 0919   CL 103 11/26/2013 0425   CO2 28 02/26/2014 0919   CO2 25 11/26/2013 0425   BUN 14 02/26/2014 0919   BUN 11 11/26/2013 0425   CREATININE 0.7 02/26/2014 0919   CREATININE 0.64 11/26/2013 0425      Component Value Date/Time   CALCIUM 9.0 02/26/2014 0919   CALCIUM 8.5 11/26/2013 0425   ALKPHOS 59 02/26/2014 0919   ALKPHOS 50 11/26/2013 0425   AST 28 02/26/2014 0919   AST 17 11/26/2013 0425   ALT 21 02/26/2014 0919   ALT 14 11/26/2013 0425   BILITOT 0.80 02/26/2014 0919   BILITOT 1.1 11/26/2013 0425         Assessment & Plan:

## 2014-04-29 NOTE — Patient Instructions (Signed)
Please remember to go to the x-ray department downstairs for your tests - we will call you with the results when they are available.  GERD (REFLUX)  is an extremely common cause of respiratory symptoms, many times with no significant heartburn at all.    It can be treated with medication, but also with lifestyle changes including avoidance of late meals, excessive alcohol, smoking cessation, and avoid fatty foods, chocolate, peppermint, colas, red wine, and acidic juices such as orange juice.  NO MINT OR MENTHOL PRODUCTS SO NO COUGH DROPS  USE SUGARLESS CANDY INSTEAD (jolley ranchers or Stover's or life savers)  NO OIL BASED VITAMINS - use powdered substitutes.(no fish oil)    Please schedule a follow up visit in 3 months but call sooner if needed

## 2014-04-30 NOTE — Assessment & Plan Note (Addendum)
-   04/29/2014  Walked RA x 3 laps @ 185 ft each stopped due to end of study, fast pace, no desat, mild sob half way through but did not worsen.  - spirometry 04/29/2014  FEV!  1.82 (64%) ratio 69 c/w minimal airflow obstruction/ restrictive changes sp LULobectomy   Symptoms seem disproportionate to findings and I couldn't be convinced that he's actually losing any ground at all with his regular walking routine which I applauded him on and reinforced continuing to do at perhaps a sltly slower pace so he never has to stop while he's walking but if worsens I would look at non-pulmonary mechanisms like occult chf and even consider a CPST if dx remains in doubt and do spirometry before and after to r/o EIA, which seems very unlikely   In meantime since he has has some coughing rec GERD diet on a trial basis to see what difference this makes.

## 2014-04-30 NOTE — Progress Notes (Signed)
Quick Note:  Spoke with pt and notified of results per Dr. Wert. Pt verbalized understanding and denied any questions.  ______ 

## 2014-05-05 ENCOUNTER — Ambulatory Visit: Payer: Medicare Other | Admitting: Thoracic Surgery (Cardiothoracic Vascular Surgery)

## 2014-05-06 DIAGNOSIS — R350 Frequency of micturition: Secondary | ICD-10-CM | POA: Insufficient documentation

## 2014-05-06 DIAGNOSIS — R39198 Other difficulties with micturition: Secondary | ICD-10-CM | POA: Insufficient documentation

## 2014-05-11 ENCOUNTER — Other Ambulatory Visit: Payer: Self-pay | Admitting: Thoracic Surgery (Cardiothoracic Vascular Surgery)

## 2014-05-11 DIAGNOSIS — C3490 Malignant neoplasm of unspecified part of unspecified bronchus or lung: Secondary | ICD-10-CM

## 2014-05-12 ENCOUNTER — Encounter: Payer: Self-pay | Admitting: Thoracic Surgery (Cardiothoracic Vascular Surgery)

## 2014-05-12 ENCOUNTER — Ambulatory Visit (INDEPENDENT_AMBULATORY_CARE_PROVIDER_SITE_OTHER): Payer: Medicare Other | Admitting: Thoracic Surgery (Cardiothoracic Vascular Surgery)

## 2014-05-12 VITALS — BP 131/75 | HR 60 | Ht 67.0 in | Wt 181.0 lb

## 2014-05-12 DIAGNOSIS — C3410 Malignant neoplasm of upper lobe, unspecified bronchus or lung: Secondary | ICD-10-CM

## 2014-05-12 NOTE — Progress Notes (Signed)
HPI:  Is returns for a scheduled 6 month followup visit.  He is an 80 year old gentleman who had a thoracoscopic left upper lobectomy for a T1 N1, stage II a non-small cell carcinoma back in March of this year. He opted not to have chemotherapy. He had a CT scan in July which showed no evidence of recurrent disease.  He says that he's discouraged that his exercise tolerance is still not back up to his preoperative level. He noticed there would be days this summer when he was having a lot more difficulty with his breathing. His weight has been stable. He has an occasional cough, no hemoptysis.  Past Medical History  Diagnosis Date  . Chicken pox   . BC (bronchogenic carcinoma)   . Hypertension     takes Losartan daily  . Shortness of breath     with exertion  . History of bronchitis     early Feb 2015  . History of colon polyps       Current Outpatient Prescriptions  Medication Sig Dispense Refill  . aspirin 81 MG tablet Take 81 mg by mouth daily.      Marland Kitchen CALCIUM-VITAMIN D PO Take 1 tablet by mouth daily.       . Clobetasol Propionate 0.05 % shampoo Apply topically.      . Cyanocobalamin (VITAMIN B 12 PO) Take by mouth every morning.      . furosemide (LASIX) 20 MG tablet Take 20 mg by mouth. Only takes as needed      . losartan (COZAAR) 50 MG tablet Take 50 mg by mouth daily.      . magnesium 30 MG tablet Take 30 mg by mouth daily.      . minocycline (DYNACIN) 50 MG tablet Take 50 mg by mouth.      . Multiple Vitamin (MULTIVITAMIN WITH MINERALS) TABS tablet Take 1 tablet by mouth daily.      . tadalafil (CIALIS) 5 MG tablet Take 5 mg by mouth.      . traMADol (ULTRAM) 50 MG tablet Take 1-2 tablets (50-100 mg total) by mouth every 6 (six) hours as needed for moderate pain.  30 tablet  0   No current facility-administered medications for this visit.    Physical Exam BP 131/75  Pulse 60  Ht 5\' 7"  (1.702 m)  Wt 181 lb (82.101 kg)  BMI 28.34 kg/m2  SpO79 76% 80 year old man  in no acute distress Well-developed and well-nourished appears in good health No cervical or supraclavicular adenopathy Lungs diminished left base otherwise clear Cardiac regular rate and rhythm normal S1 and S2  Diagnostic Tests: CHEST 2 VIEW  COMPARISON: CT chest of 02/26/2014 and chest x-ray of 01/27/2014  FINDINGS:  Aeration of the left lung base has improved in the interval.  Postoperative changes on the left are stable. The right lung is  clear. Heart size is stable. No bony abnormality is seen.  IMPRESSION:  Improved aeration of the left lung base. Stable postoperative change  on the left.  Electronically Signed  By: Ivar Drape M.D.  On: 04/29/2014 13:39   Impression: 80 year old gentleman who had a thoracoscopic left upper lobectomy for a stage IIa non-small cell lung cancer 6 months ago. He opted not to have adjuvant chemotherapy.  He looks great. I think he is having a little trouble adjusting to his "new normal." I told him that his exercise tolerance may not be quite what it was prior to surgery. I am impressed that he is  being as active as he is all things considered.  He has no evidence of recurrent disease at this time. He is at high risk for recurrence being stage II a disease.  He says that he has an appointment with Dr. Marin Olp in about a month. He is to have another CT of the chest at that time.  I will plan to see him back in 6 months for his one-year followup and plan to do a CT of the chest at that time.

## 2014-06-01 ENCOUNTER — Ambulatory Visit (HOSPITAL_BASED_OUTPATIENT_CLINIC_OR_DEPARTMENT_OTHER)
Admission: RE | Admit: 2014-06-01 | Discharge: 2014-06-01 | Disposition: A | Discharge status: Home / Self Care | Payer: Medicare Other | Source: Ambulatory Visit | Attending: Family | Admitting: Family

## 2014-06-01 ENCOUNTER — Encounter (HOSPITAL_BASED_OUTPATIENT_CLINIC_OR_DEPARTMENT_OTHER): Payer: Self-pay

## 2014-06-01 ENCOUNTER — Other Ambulatory Visit (HOSPITAL_BASED_OUTPATIENT_CLINIC_OR_DEPARTMENT_OTHER): Payer: Medicare Other | Admitting: Lab

## 2014-06-01 ENCOUNTER — Ambulatory Visit (HOSPITAL_BASED_OUTPATIENT_CLINIC_OR_DEPARTMENT_OTHER): Payer: Medicare Other | Admitting: Hematology & Oncology

## 2014-06-01 VITALS — BP 131/82 | HR 55 | Temp 97.8°F | Resp 16 | Wt 180.0 lb

## 2014-06-01 DIAGNOSIS — C779 Secondary and unspecified malignant neoplasm of lymph node, unspecified: Secondary | ICD-10-CM

## 2014-06-01 DIAGNOSIS — R0602 Shortness of breath: Secondary | ICD-10-CM

## 2014-06-01 DIAGNOSIS — C3412 Malignant neoplasm of upper lobe, left bronchus or lung: Secondary | ICD-10-CM | POA: Insufficient documentation

## 2014-06-01 DIAGNOSIS — Z902 Acquired absence of lung [part of]: Secondary | ICD-10-CM | POA: Diagnosis not present

## 2014-06-01 DIAGNOSIS — C3490 Malignant neoplasm of unspecified part of unspecified bronchus or lung: Secondary | ICD-10-CM

## 2014-06-01 DIAGNOSIS — R911 Solitary pulmonary nodule: Secondary | ICD-10-CM

## 2014-06-01 LAB — CBC WITH DIFFERENTIAL (CANCER CENTER ONLY)
BASO#: 0 10*3/uL (ref 0.0–0.2)
BASO%: 0.3 % (ref 0.0–2.0)
EOS%: 3.2 % (ref 0.0–7.0)
Eosinophils Absolute: 0.2 10*3/uL (ref 0.0–0.5)
HEMATOCRIT: 46.9 % (ref 38.7–49.9)
HGB: 17.1 g/dL (ref 13.0–17.1)
LYMPH#: 2.2 10*3/uL (ref 0.9–3.3)
LYMPH%: 31.6 % (ref 14.0–48.0)
MCH: 33.2 pg (ref 28.0–33.4)
MCHC: 36.5 g/dL — ABNORMAL HIGH (ref 32.0–35.9)
MCV: 91 fL (ref 82–98)
MONO#: 0.6 10*3/uL (ref 0.1–0.9)
MONO%: 9.1 % (ref 0.0–13.0)
NEUT#: 3.8 10*3/uL (ref 1.5–6.5)
NEUT%: 55.8 % (ref 40.0–80.0)
Platelets: 168 10*3/uL (ref 145–400)
RBC: 5.15 10*6/uL (ref 4.20–5.70)
RDW: 12.7 % (ref 11.1–15.7)
WBC: 6.8 10*3/uL (ref 4.0–10.0)

## 2014-06-01 LAB — CMP (CANCER CENTER ONLY)
ALK PHOS: 58 U/L (ref 26–84)
ALT: 23 U/L (ref 10–47)
AST: 26 U/L (ref 11–38)
Albumin: 3.6 g/dL (ref 3.3–5.5)
BILIRUBIN TOTAL: 1.2 mg/dL (ref 0.20–1.60)
BUN, Bld: 11 mg/dL (ref 7–22)
CO2: 26 mEq/L (ref 18–33)
CREATININE: 0.8 mg/dL (ref 0.6–1.2)
Calcium: 9.1 mg/dL (ref 8.0–10.3)
Chloride: 107 mEq/L (ref 98–108)
Glucose, Bld: 110 mg/dL (ref 73–118)
Potassium: 4.1 mEq/L (ref 3.3–4.7)
Sodium: 145 mEq/L (ref 128–145)
Total Protein: 6.6 g/dL (ref 6.4–8.1)

## 2014-06-01 MED ORDER — IOHEXOL 300 MG/ML  SOLN
80.0000 mL | Freq: Once | INTRAMUSCULAR | Status: AC | PRN
  Administered 2014-06-01: 80 mL via INTRAVENOUS

## 2014-06-01 NOTE — Progress Notes (Signed)
Hematology and Oncology Follow Up Visit  Adam [name] [id number] [birth year] 80 y.o. 06/01/2014   Principle Diagnosis:   Stage IIA (T1bN1M0) adenocarcinoma of the left upper lung  Current Therapy:    Observation     Interim History:  [name] is back for followup. He's doing fairly well. His main problem has been some shortness of breath with exertion. He did see pulmonology. They did not think that there is much wrong with his lungs. I did not recommend any therapy. He may have to go back to be seen. He may need some pulmonary physical therapy.   He'did undergo surgical resection. This was done on March 30. He had a left upper lobe resection. It is possible that he may have some pulmonary dysfunction.  He has no cough. He has no hemoptysis. Has no other symptoms. Paralytic go ahead and get a CT scan on him today. The CT scan did not show any evidence of recurrent disease.   Currently, his performance status is ECOG 1  Medications: Current outpatient prescriptions:aspirin 81 MG tablet, Take 81 mg by mouth daily., Disp: , Rfl: ;  CALCIUM-VITAMIN D PO, Take 1 tablet by mouth daily. , Disp: , Rfl: ;  Clobetasol Propionate 0.05 % shampoo, Apply topically., Disp: , Rfl: ;  Cyanocobalamin (VITAMIN B 12 PO), Take by mouth every morning., Disp: , Rfl: ;  furosemide (LASIX) 20 MG tablet, Take 20 mg by mouth. Only takes as needed, Disp: , Rfl:  losartan (COZAAR) 50 MG tablet, Take 50 mg by mouth daily., Disp: , Rfl: ;  magnesium 30 MG tablet, Take 30 mg by mouth daily., Disp: , Rfl: ;  minocycline (DYNACIN) 50 MG tablet, Take 50 mg by mouth., Disp: , Rfl: ;  Multiple Vitamin (MULTIVITAMIN WITH MINERALS) TABS tablet, Take 1 tablet by mouth daily., Disp: , Rfl: ;  Multiple Vitamins-Minerals (EYE VITAMINS) CAPS, Take by mouth 2 (two) times daily., Disp: , Rfl:  tadalafil (CIALIS) 5 MG tablet, Take 5 mg by mouth., Disp: , Rfl: ;  traMADol (ULTRAM) 50 MG tablet, Take 1-2 tablets (50-100 mg total)  by mouth every 6 (six) hours as needed for moderate pain., Disp: 30 tablet, Rfl: 0  Allergies:  Allergies  Allergen Reactions  . Adhesive [Tape]     Skin Irritation    Past Medical History, Surgical history, Social history, and Family History were reviewed and updated.  Review of Systems: As above  Physical Exam:  weight is 180 lb (81.647 kg). His oral temperature is 97.8 F (36.6 C). His blood pressure is 131/82 and his pulse is 55. His respiration is 16.   Well-developed and well-nourished gentleman. Lungs are clear bilaterally. Cardiac exam is regular rate and rhythm. Abdomen soft. Has good bowel sounds. There is no fluid wave. There is no palpable liver or spleen tip. Exam shows a thoracoscopy scar in the left lateral chest wall. No erythema is noted. No tenderness is noted. Extremities shows no clubbing cyanosis or edema. Has good range of motion of his joints. Neurological exam shows no focal neurological deficits.  Lab Results  Component Value Date   WBC 6.8 06/01/2014   HGB 17.1 06/01/2014   HCT 46.9 06/01/2014   MCV 91 06/01/2014   PLT 168 06/01/2014     Chemistry      Component Value Date/Time   NA 145 06/01/2014 0823   NA 138 11/26/2013 0425   K 4.1 06/01/2014 0823   K 4.0 11/26/2013 0425   CL  107 06/01/2014 0823   CL 103 11/26/2013 0425   CO2 26 06/01/2014 0823   CO2 25 11/26/2013 0425   BUN 11 06/01/2014 0823   BUN 11 11/26/2013 0425   CREATININE 0.8 06/01/2014 0823   CREATININE 0.64 11/26/2013 0425      Component Value Date/Time   CALCIUM 9.1 06/01/2014 0823   CALCIUM 8.5 11/26/2013 0425   ALKPHOS 58 06/01/2014 0823   ALKPHOS 50 11/26/2013 0425   AST 26 06/01/2014 0823   AST 17 11/26/2013 0425   ALT 23 06/01/2014 0823   ALT 14 11/26/2013 0425   BILITOT 1.20 06/01/2014 0823   BILITOT 1.1 11/26/2013 0425         Impression and Plan: Adam [name] is 80 year old gentleman with stage IIA adenocarcinoma the left lung. Had one positive lymph node.  I really don't see that adjuvant  chemotherapy is really going to be a back rate of benefit to him he is 80 years old. He does have some other health issues. He wants to be able to travel and be active. He understands that the risk of recurrence probably is going to be 40-50%.  He just does not want to have his quality of life compromised by chemotherapy.  I totally understand this. I agree with him. I strongly do appreciate that quality of life is very important.  I am still am not sure as to what is causing his shortness of breath. He says that when he talks for a long time, that he has some shortness of breath. Again, I do think that he probably needs to go back to see the pulmonologist.  We will follow him along with scans. I will do another CT scan in April of 2016.. We will coordinate his scans and his wife scans together.  I spent a good 30 minutes with him today.   Volanda Napoleon, MD 10/5/20151:10 PM

## 2014-06-04 ENCOUNTER — Ambulatory Visit: Payer: Medicare Other | Admitting: Hematology & Oncology

## 2014-06-04 ENCOUNTER — Other Ambulatory Visit (HOSPITAL_BASED_OUTPATIENT_CLINIC_OR_DEPARTMENT_OTHER): Payer: Medicare Other

## 2014-06-04 ENCOUNTER — Other Ambulatory Visit: Payer: Medicare Other | Admitting: Lab

## 2014-06-05 ENCOUNTER — Other Ambulatory Visit: Payer: Medicare Other | Admitting: Lab

## 2014-06-05 ENCOUNTER — Other Ambulatory Visit (HOSPITAL_BASED_OUTPATIENT_CLINIC_OR_DEPARTMENT_OTHER): Payer: Medicare Other

## 2014-06-05 ENCOUNTER — Ambulatory Visit: Payer: Medicare Other | Admitting: Hematology & Oncology

## 2014-06-30 DIAGNOSIS — K219 Gastro-esophageal reflux disease without esophagitis: Secondary | ICD-10-CM | POA: Insufficient documentation

## 2014-07-21 ENCOUNTER — Encounter: Payer: Self-pay | Admitting: Adult Health

## 2014-07-21 ENCOUNTER — Ambulatory Visit (INDEPENDENT_AMBULATORY_CARE_PROVIDER_SITE_OTHER)
Admission: RE | Admit: 2014-07-21 | Discharge: 2014-07-21 | Disposition: A | Discharge status: Home / Self Care | Payer: Medicare Other | Source: Ambulatory Visit | Attending: Adult Health | Admitting: Adult Health

## 2014-07-21 ENCOUNTER — Ambulatory Visit (INDEPENDENT_AMBULATORY_CARE_PROVIDER_SITE_OTHER): Payer: Medicare Other | Admitting: Adult Health

## 2014-07-21 VITALS — BP 118/82 | HR 76 | Temp 97.9°F | Ht 67.0 in | Wt 182.2 lb

## 2014-07-21 DIAGNOSIS — R0602 Shortness of breath: Secondary | ICD-10-CM

## 2014-07-21 DIAGNOSIS — J449 Chronic obstructive pulmonary disease, unspecified: Secondary | ICD-10-CM

## 2014-07-21 DIAGNOSIS — R0600 Dyspnea, unspecified: Secondary | ICD-10-CM | POA: Diagnosis not present

## 2014-07-21 MED ORDER — TIOTROPIUM BROMIDE-OLODATEROL 2.5-2.5 MCG/ACT IN AERS: 2.0000 | INHALATION_SPRAY | Freq: Every day | RESPIRATORY_TRACT | Status: DC

## 2014-07-21 NOTE — Patient Instructions (Signed)
Begin Stiolto  2 puffs daily  Begin Prilosec 20mg  daily  Chest xray today  Follow up Dr. Melvyn Novas  In 6 weeks and As needed  Please contact office for sooner follow up if symptoms do not improve or worsen or seek emergency care

## 2014-07-21 NOTE — Progress Notes (Signed)
Subjective:    Patient ID: Adam [name], male    DOB: Nov 02, 1928     MRN: [id number]  HPI  67 yowm quit smoking 1975 started with cough Feb 2015 > dx with [place] followed by [facility]/ [facility] but no chemo/RT to date.   Admit date: 11/24/2013  Discharge date: 11/29/2013  Admission Diagnoses:  1. Left upper lobe mass  2. History of tobacco abuse  3. History of asbestos exposure  Discharge Diagnoses:  1. 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 History of tobacco abuse  3. History of asbestos exposure  Procedure (s):  Left video-assisted thoracoscopy, thoracoscopic left upper lobectomy, mediastinal lymph node dissection, resection of mediastinal cyst, On-Q local anesthetic catheter placement by Dr. Roxan Hockey on 11/24/2013.   04/29/2014 1st [place] [facility] office visit/ Wert  Chief Complaint  Patient presents with  . Pulmonary Consult    Referred per [facility]. Pt c/o dyspnea x 6 months- with or without any exertion. He wakes up sometimes "breathing fast" and gets SOB after walking approx 2 minutes.   onset of sob late spring /early summer 2015 gradual onset somewhat progressive followed by McDonald at [facility].  Walking slt incline one eighth 3-4 x weekly sob but doesn't stop No problem supine Has not tried inhalers  07/21/2014 Follow up COPD and Lung cancer (Stage IIA Adenocarcinoma of LUL ) s/p Resection Returns for persistent symptoms of dyspnea . Occasional cough . No fever or discolored mucus .  Complains of frequent heartburn .  PFTs showed moderate COPD 04/214 Feels his breathing is getting worse over last few months .  Got better after lung resection in March but noticed not able to walk as much for last 2-3 months. Gets winded easily.  No desats with walk test in office last ov.  Walk test in office today with not desats.  Most recent CT chest showed no significant change w/ no evidence of recurrence or mets. Followed by Dr. Marin Olp  . He denies any chest pain, orthopnea, PND, syncope, hemoptysis, orthopnea or leg swelling.     Current Medications, Allergies, Complete Past Medical History, Past Surgical History, Family History, and Social History were reviewed in [employer] record.   Review of Systems  Constitutional: Negative for fever, chills, activity change, appetite change and unexpected weight change.  HENT: Negative for congestion, dental problem, postnasal drip, rhinorrhea, sneezing, sore throat, trouble swallowing and voice change.   Eyes: Negative for visual disturbance.  Respiratory: Shortness of breath. . Negative for choking.   Cardiovascular: Negative for chest pain and leg swelling.  Gastrointestinal: Negative for nausea, vomiting and abdominal pain.  Genitourinary: Negative for difficulty urinating.  Musculoskeletal: Negative for arthralgias.  Skin: Negative for rash.  Psychiatric/Behavioral: Negative for behavioral problems and confusion.       Objective:   Physical Exam amb wm nad  HEENT mild turbinate edema.  Oropharynx no thrush or excess pnd or cobblestoning.  No JVD or cervical adenopathy. Mild accessory muscle hypertrophy. Trachea midline, nl thryroid. Chest was hyperinflated by percussion with diminished breath sounds and moderate increased exp time without wheeze. Hoover sign positive at mid inspiration. Regular rate and rhythm without murmur gallop or rub or increase P2 or edema.  Abd: no hsm, nl excursion. Ext warm without cyanosis or clubbing.     CT chest 02/26/14  Postop changes from left upper lobectomy. Small left pleural  effusion versus pleural thickening.  No evidence of residual or  metastatic carcinoma within the thorax.   cxr 04/29/14 Aeration of the left lung base has improved in the interval.  Postoperative changes on the left are stable. The right lung is  clear. Heart size is stable. No bony abnormality is seen.  Lab Results  Component Value Date     WBC 7.0 02/26/2014   HGB 16.7 02/26/2014   HCT 46.5 02/26/2014   MCV 91 02/26/2014   PLT 176 02/26/2014       Chemistry      Component Value Date/Time   NA 142 02/26/2014 0919   NA 138 11/26/2013 0425   K 4.1 02/26/2014 0919   K 4.0 11/26/2013 0425   CL 100 02/26/2014 0919   CL 103 11/26/2013 0425   CO2 28 02/26/2014 0919   CO2 25 11/26/2013 0425   BUN 14 02/26/2014 0919   BUN 11 11/26/2013 0425   CREATININE 0.7 02/26/2014 0919   CREATININE 0.64 11/26/2013 0425      Component Value Date/Time   CALCIUM 9.0 02/26/2014 0919   CALCIUM 8.5 11/26/2013 0425   ALKPHOS 59 02/26/2014 0919   ALKPHOS 50 11/26/2013 0425   AST 28 02/26/2014 0919   AST 17 11/26/2013 0425   ALT 21 02/26/2014 0919   ALT 14 11/26/2013 0425   BILITOT 0.80 02/26/2014 0919   BILITOT 1.1 11/26/2013 0425      CT chest  06/01/14 Postoperative changes of left upper lobectomy without evidence of recurrent or metastatic disease. 2. Tiny left pleural effusion, decreased in size from 02/26/2014. 3. Scattered small pulmonary nodules in the right lung are unchanged from baseline examination of 10/03/2013.      Assessment & Plan:

## 2014-07-27 NOTE — Assessment & Plan Note (Signed)
COPD -suspect symptoms of dsypnea of secondary to COPD  No exertional desats on walk test.  Most recent CT w/out evidence of reoccurrece of cancer  Check CXR today  ?GERD aggravating symptoms   Plan  Begin Stiolto  2 puffs daily  Begin Prilosec 20mg  daily  Chest xray today  Follow up Dr. Melvyn Novas  In 6 weeks and As needed  Please contact office for sooner follow up if symptoms do not improve or worsen or seek emergency care

## 2014-07-28 ENCOUNTER — Ambulatory Visit: Payer: Medicare Other | Admitting: Internal Medicine

## 2014-07-29 ENCOUNTER — Ambulatory Visit: Payer: Medicare Other | Admitting: Internal Medicine

## 2014-08-05 ENCOUNTER — Ambulatory Visit: Payer: Medicare Other | Admitting: Internal Medicine

## 2014-08-06 ENCOUNTER — Telehealth: Payer: Self-pay | Admitting: Internal Medicine

## 2014-08-06 MED ORDER — TIOTROPIUM BROMIDE-OLODATEROL 2.5-2.5 MCG/ACT IN AERS: 2.0000 | INHALATION_SPRAY | Freq: Every day | RESPIRATORY_TRACT | Status: DC

## 2014-08-06 NOTE — Telephone Encounter (Signed)
Called spoke with spouse. Aware RX have been sent in. Nothing further needed

## 2014-08-12 ENCOUNTER — Telehealth: Payer: Self-pay | Admitting: Internal Medicine

## 2014-08-12 MED ORDER — TIOTROPIUM BROMIDE-OLODATEROL 2.5-2.5 MCG/ACT IN AERS: 2.0000 | INHALATION_SPRAY | Freq: Every day | RESPIRATORY_TRACT | Status: DC

## 2014-08-12 NOTE — Telephone Encounter (Signed)
Called spoke with patient who believes he does have a formulary at home He will bring this to the office when he comes to pick up another sample of Stiolto Pt okay with all of MW's recs and voiced his understanding  Stiolto sample documented per protocol and placed up front for pt

## 2014-08-12 NOTE — Telephone Encounter (Signed)
have him come in with drug formulary to pick the cheapest and in meantime give 2 week sample of stiolto - there are no generics

## 2014-08-12 NOTE — Telephone Encounter (Signed)
Spoke with pt, states that he was given a stiolto sample by TP for 14 days to try, states it "helped some but can't tell much difference in breathing", went to pharmacy to have rx filled, med isn't covered by insurance and is going to cost pt $265/30 day supply.  Pt is wanting to know if there is a generic or alternative he can try instead.    Dr. Melvyn Novas please advise.  Thanks!

## 2014-08-13 ENCOUNTER — Telehealth: Payer: Self-pay | Admitting: Adult Health

## 2014-08-13 NOTE — Telephone Encounter (Signed)
Per the 12/16 phone note the Stiolto started at the 11/24 visit with TP will cost pt $265/30days.  He also is unable to tell a remarkable difference in his breathing. Pt came to the office to pick up sample of Stiolto and did bring copy of his formulary with him Copies made of the pertinent pages of the formulary Will discuss with TP to find a more cost effective alternative and will call pt - he is aware  Please advise TP, thank you.

## 2014-08-18 NOTE — Telephone Encounter (Signed)
Discussed with TP > okay to provide another sample of Stiolto.  Would keep the 1.6.16 appt with MW for follow and to discuss covered alternatives to the [facility].  Thanks.  LMOM TCB x1 for pt.

## 2014-08-19 MED ORDER — TIOTROPIUM BROMIDE-OLODATEROL 2.5-2.5 MCG/ACT IN AERS: 2.0000 | INHALATION_SPRAY | Freq: Every day | RESPIRATORY_TRACT | Status: AC

## 2014-08-19 NOTE — Telephone Encounter (Signed)
Pt aware of rec's per TP. Aware to keep appt. Sample up front for Stiolto. Nothing further needed.

## 2014-08-19 NOTE — Telephone Encounter (Signed)
Pt returned call - [phone]

## 2014-09-02 ENCOUNTER — Ambulatory Visit (INDEPENDENT_AMBULATORY_CARE_PROVIDER_SITE_OTHER)
Admission: RE | Admit: 2014-09-02 | Discharge: 2014-09-02 | Disposition: A | Discharge status: Home / Self Care | Payer: Medicare Other | Source: Ambulatory Visit | Attending: Internal Medicine | Admitting: Internal Medicine

## 2014-09-02 ENCOUNTER — Encounter: Payer: Self-pay | Admitting: Internal Medicine

## 2014-09-02 ENCOUNTER — Ambulatory Visit (INDEPENDENT_AMBULATORY_CARE_PROVIDER_SITE_OTHER): Payer: Medicare Other | Admitting: Internal Medicine

## 2014-09-02 DIAGNOSIS — R0600 Dyspnea, unspecified: Secondary | ICD-10-CM

## 2014-09-02 NOTE — Patient Instructions (Addendum)
Try off stiolto for about a month to see what difference if any it makes in  Outdoor walking   Please remember to go to the  x-ray department downstairs for your tests - we will call you with the results when they are available.    Please schedule a follow up office visit in 4 weeks, sooner if needed pfts

## 2014-09-02 NOTE — Progress Notes (Signed)
Subjective:    Patient ID: Adam [name], male    DOB: 22-Jul-1929     MRN: [id number]    Brief patient profile:  31 yowm quit smoking 1975 started with cough Feb 2015 > dx with [place] followed by [facility]/ [facility] but no chemo/RT to date.   Admit date: 11/24/2013  Discharge date: 11/29/2013  Admission Diagnoses:  1. Left upper lobe mass  2. History of tobacco abuse  3. History of asbestos exposure  Discharge Diagnoses:  1. 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 History of tobacco abuse  3. History of asbestos exposure  Procedure (s):  Left video-assisted thoracoscopy, thoracoscopic left upper lobectomy, mediastinal lymph node dissection, resection of mediastinal cyst, On-Q local anesthetic catheter placement by Dr. Roxan Hockey on 11/24/2013.    History of Present Illness  04/29/2014 1st [place] [facility] office visit/ [place]  Chief Complaint  Patient presents with  . Pulmonary Consult    Referred per [facility]. Pt c/o dyspnea x 6 months- with or without any exertion. He wakes up sometimes "breathing fast" and gets SOB after walking approx 2 minutes.   onset of sob late spring /early summer 2015 gradual onset somewhat progressive followed by McDonald at [facility].  Walking slt incline one eighth 3-4 x weekly sob but doesn't stop No problem supine Has not tried inhalers rec  No change rx   07/21/2014 Follow up COPD and Lung cancer (Stage IIA Adenocarcinoma of LUL ) s/p Resection Returns for persistent symptoms of dyspnea . Occasional cough . No fever or discolored mucus .  Complains of frequent heartburn .  PFTs showed moderate COPD 04/214 Feels his breathing is getting worse over last few months .  Got better after lung resection in March but noticed not able to walk as much for last 2-3 months. Gets winded easily.  No desats with walk test in office last ov.  Walk test in office today with not desats.  Most recent CT chest showed no  significant change w/ no evidence of recurrence or mets. Followed by Dr. Marin Olp . Rec Begin Stiolto  2 puffs daily  Begin Prilosec 20mg  daily  Chest xray today  Follow up Dr. Melvyn Novas  In 6 weeks and As needed     09/02/2014 f/u ov/Hamlin Devine re: chronic doe/ on stiolto can't tell it's helping  Chief Complaint  Patient presents with  . Follow-up    Pt states that his breathing is unchanged. No new co's today.    4 months prior to OV  Best days able to a mile without  [place] getting exhausted but now takes 20-30 min  does it 3-4 times a week Sneezed about the time the breathing got worse and now pain with twist or bending over only, L anterior   No obvious day to day or daytime variabilty or assoc chronic cough or  chest tightness, subjective wheeze overt sinus or hb symptoms. No unusual exp hx or h/o childhood pna/ asthma or knowledge of premature birth.  Sleeping ok without nocturnal  or early am exacerbation  of respiratory  c/o's or need for noct saba. Also denies any obvious fluctuation of symptoms with weather or environmental changes or other aggravating or alleviating factors except as outlined above   Current Medications, Allergies, Complete Past Medical History, Past Surgical History, Family History, and Social History were reviewed in [employer] record.  ROS  The following are not active complaints  unless bolded sore throat, dysphagia, dental problems, itching, sneezing,  nasal congestion or excess/ purulent secretions, ear ache,   fever, chills, sweats, unintended wt loss, pleuritic or exertional cp, hemoptysis,  orthopnea pnd or leg swelling, presyncope, palpitations, heartburn, abdominal pain, anorexia, nausea, vomiting, diarrhea  or change in bowel or urinary habits, change in stools or urine, dysuria,hematuria,  rash, arthralgias, visual complaints, headache, numbness weakness or ataxia or problems with walking or coordination,  change in  mood/affect or memory.                    Objective:   Physical Exam  Wt Readings from Last 3 Encounters:  09/02/14 80.74 kg (178 lb)  07/21/14 82.645 kg (182 lb 3.2 oz)  06/01/14 81.647 kg (180 lb)    Vital signs reviewed  amb wm nad  HEENT mild turbinate edema.  Oropharynx no thrush or excess pnd or cobblestoning.  No JVD or cervical adenopathy. Mild accessory muscle hypertrophy. Trachea midline, nl thryroid. Chest was hyperinflated by percussion with diminished breath sounds and moderate increased exp time without wheeze. Hoover sign positive at mid inspiration. Regular rate and rhythm without murmur gallop or rub or increase P2 or edema.  Abd: no hsm, nl excursion. Ext warm without cyanosis or clubbing.          cxr 09/02/14 Images reviewed No acute cardiopulmonary abnormality seen. Stable chronic findings as described above.        Assessment & Plan:

## 2014-09-03 NOTE — Progress Notes (Signed)
Quick Note:  Spoke with pt and notified of results per Dr. Wert. Pt verbalized understanding and denied any questions.  ______ 

## 2014-09-19 ENCOUNTER — Encounter: Payer: Self-pay | Admitting: Internal Medicine

## 2014-09-19 NOTE — Assessment & Plan Note (Addendum)
-   04/29/2014  Walked RA x 3 laps @ 185 ft each stopped due to end of study, fast pace, no desat, mild sob half way through but did not worsen.  - spirometry 04/29/2014  FEV!  1.82 (64%) ratio 69     I had an extended discussion with the patient reviewing all relevant studies completed to date and  lasting 15 to 20 minutes of a 25 minute visit on the following ongoing concerns:   1) really very little evidence that he has sign airflow limitation at this point/ barely qualifies as Dx of copd at all and did not respond to stiolto so reasonable to try off, the reverse of a therapeutic trial   2) continue to rx GERD for now but if no better on next ov consider trial off this as well     Each maintenance medication was reviewed in detail including most importantly the difference between maintenance and as needed and under what circumstances the prns are to be used.  Please see instructions for details which were reviewed in writing and the patient given a copy.

## 2014-09-30 ENCOUNTER — Ambulatory Visit: Payer: Medicare Other | Admitting: Internal Medicine

## 2014-10-26 ENCOUNTER — Other Ambulatory Visit: Payer: Self-pay | Admitting: Internal Medicine

## 2014-10-26 DIAGNOSIS — R0600 Dyspnea, unspecified: Secondary | ICD-10-CM

## 2014-10-27 ENCOUNTER — Ambulatory Visit (INDEPENDENT_AMBULATORY_CARE_PROVIDER_SITE_OTHER): Payer: Medicare Other | Admitting: Internal Medicine

## 2014-10-27 ENCOUNTER — Encounter: Payer: Self-pay | Admitting: Internal Medicine

## 2014-10-27 ENCOUNTER — Other Ambulatory Visit (INDEPENDENT_AMBULATORY_CARE_PROVIDER_SITE_OTHER): Payer: Medicare Other

## 2014-10-27 DIAGNOSIS — R0600 Dyspnea, unspecified: Secondary | ICD-10-CM

## 2014-10-27 LAB — CBC WITH DIFFERENTIAL/PLATELET
BASOS PCT: 0.3 % (ref 0.0–3.0)
Basophils Absolute: 0 10*3/uL (ref 0.0–0.1)
Eosinophils Absolute: 0.2 10*3/uL (ref 0.0–0.7)
Eosinophils Relative: 2.7 % (ref 0.0–5.0)
HCT: 50.2 % (ref 39.0–52.0)
HEMOGLOBIN: 17.4 g/dL — AB (ref 13.0–17.0)
Lymphocytes Relative: 28.1 % (ref 12.0–46.0)
Lymphs Abs: 2.4 10*3/uL (ref 0.7–4.0)
MCHC: 34.7 g/dL (ref 30.0–36.0)
MCV: 92.9 fl (ref 78.0–100.0)
MONO ABS: 0.7 10*3/uL (ref 0.1–1.0)
Monocytes Relative: 8.6 % (ref 3.0–12.0)
NEUTROS PCT: 60.3 % (ref 43.0–77.0)
Neutro Abs: 5.1 10*3/uL (ref 1.4–7.7)
PLATELETS: 187 10*3/uL (ref 150.0–400.0)
RBC: 5.4 Mil/uL (ref 4.22–5.81)
RDW: 12.2 % (ref 11.5–15.5)
WBC: 8.5 10*3/uL (ref 4.0–10.5)

## 2014-10-27 LAB — PULMONARY FUNCTION TEST
DL/VA % pred: 127 %
DL/VA: 5.6 ml/min/mmHg/L
DLCO UNC: 18.38 ml/min/mmHg
DLCO unc % pred: 64 %
FEF 25-75 Post: 1.22 L/sec
FEF 25-75 Pre: 1.3 L/sec
FEF2575-%Change-Post: -5 %
FEF2575-%PRED-POST: 80 %
FEF2575-%Pred-Pre: 84 %
FEV1-%CHANGE-POST: 2 %
FEV1-%PRED-POST: 81 %
FEV1-%Pred-Pre: 79 %
FEV1-Post: 1.92 L
FEV1-Pre: 1.87 L
FEV1FVC-%Change-Post: 15 %
FEV1FVC-%Pred-Pre: 95 %
FEV6-%Change-Post: -7 %
FEV6-%Pred-Post: 78 %
FEV6-%Pred-Pre: 84 %
FEV6-Post: 2.44 L
FEV6-Pre: 2.64 L
FEV6FVC-%Change-Post: 1 %
FEV6FVC-%PRED-PRE: 106 %
FEV6FVC-%Pred-Post: 108 %
FVC-%Change-Post: -11 %
FVC-%PRED-PRE: 80 %
FVC-%Pred-Post: 71 %
FVC-Post: 2.44 L
FVC-Pre: 2.74 L
POST FEV1/FVC RATIO: 79 %
PRE FEV6/FVC RATIO: 98 %
Post FEV6/FVC ratio: 100 %
Pre FEV1/FVC ratio: 68 %
RV % PRED: 87 %
RV: 2.25 L
TLC % pred: 75 %
TLC: 4.9 L

## 2014-10-27 LAB — BASIC METABOLIC PANEL
BUN: 15 mg/dL (ref 6–23)
CALCIUM: 9.4 mg/dL (ref 8.4–10.5)
CO2: 31 meq/L (ref 19–32)
Chloride: 102 mEq/L (ref 96–112)
Creatinine, Ser: 0.87 mg/dL (ref 0.40–1.50)
GFR: 88.98 mL/min (ref >60.00)
Glucose, Bld: 95 mg/dL (ref 70–99)
Potassium: 3.9 mEq/L (ref 3.5–5.1)
Sodium: 138 mEq/L (ref 135–145)

## 2014-10-27 LAB — BRAIN NATRIURETIC PEPTIDE: PRO B NATRI PEPTIDE: 27 pg/mL (ref 0.0–100.0)

## 2014-10-27 LAB — TSH: TSH: 0.81 u[IU]/mL (ref 0.35–4.50)

## 2014-10-27 NOTE — Progress Notes (Signed)
PFT done today. 

## 2014-10-27 NOTE — Progress Notes (Signed)
Subjective:    Patient ID: Adam [name], male    DOB: Mar 13, 1929     MRN: [id number]    Brief patient profile:  75 yowm quit smoking 1975 started with cough Feb 2015 > dx with [place] followed by [facility]/ [facility] but no chemo/RT to date and pfts s sign obst 10/27/14    Admit date: 11/24/2013  Discharge date: 11/29/2013  Admission Diagnoses:  1. Left upper lobe mass  2. History of tobacco abuse  3. History of asbestos exposure  Discharge Diagnoses:  1. 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 History of tobacco abuse  3. History of asbestos exposure  Procedure (s):  Left video-assisted thoracoscopy, thoracoscopic left upper lobectomy, mediastinal lymph node dissection, resection of mediastinal cyst, On-Q local anesthetic catheter placement by Dr. Roxan Hockey on 11/24/2013.    History of Present Illness  04/29/2014 1st [place] [facility] office visit/ W[facility]  Chief Complaint  Patient presents with  . Pulmonary Consult    Referred per [facility]. Pt c/o dyspnea x 6 months- with or without any exertion. He wakes up sometimes "breathing fast" and gets SOB after walking approx 2 minutes.   onset of sob late spring /early summer 2015 gradual onset somewhat progressive followed by McDonald at [facility].  Walking slt incline one eighth 3-4 x weekly sob but doesn't stop No problem supine Has not tried inhalers rec  No change rx   07/21/2014 Follow up COPD and Lung cancer (Stage IIA Adenocarcinoma of LUL ) s/p Resection Returns for persistent symptoms of dyspnea . Occasional cough . No fever or discolored mucus .  Complains of frequent heartburn .  PFTs showed moderate COPD 04/214 Feels his breathing is getting worse over last few months .  Got better after lung resection in March but noticed not able to walk as much for last 2-3 months. Gets winded easily.  No desats with walk test in office last ov.  Walk test in office today with not desats.  Most  recent CT chest showed no significant change w/ no evidence of recurrence or mets. Followed by Dr. Marin Olp . Rec Begin Stiolto  2 puffs daily  Begin Prilosec 20mg  daily  Chest xray today  Follow up Dr. Melvyn Novas  In 6 weeks and As needed     09/02/2014 f/u ov/[facility]ert re: chronic doe/ on stiolto can't tell it's helping  Chief Complaint  Patient presents with  . Follow-up    Pt states that his breathing is unchanged. No new co's today.    4 months prior to OV  Best days able to a mile without  [place] getting exhausted but now takes 20-30 min  does it 3-4 times a week Sneezed about the time the breathing got worse and now pain with twist or bending over only, L anterior rec Try off stiolto for about a month to see what difference if any it makes in  Outdoor walking       10/27/2014 f/u ov/Wert re: chronic sob, no change on/off stiolto or prilosec  Chief Complaint  Patient presents with  . Follow-up    PFT done today. Pt states that his breathing is unchanged. He states that he has been having CP on left side- happens when he moves a certain way, coughs or sneezes.    cp present x months, resolves lying flat, no pleuritic component and not worse with ex   No obvious day to day or  daytime variabilty or assoc chronic cough or  chest tightness, subjective wheeze overt sinus or hb symptoms. No unusual exp hx or h/o childhood pna/ asthma or knowledge of premature birth.  Sleeping ok without nocturnal  or early am exacerbation  of respiratory  c/o's or need for noct saba. Also denies any obvious fluctuation of symptoms with weather or environmental changes or other aggravating or alleviating factors except as outlined above   Current Medications, Allergies, Complete Past Medical History, Past Surgical History, Family History, and Social History were reviewed in [employer] record.  ROS  The following are not active complaints unless bolded sore throat,  dysphagia, dental problems, itching, sneezing,  nasal congestion or excess/ purulent secretions, ear ache,   fever, chills, sweats, unintended wt loss, pleuritic or exertional cp, hemoptysis,  orthopnea pnd or leg swelling, presyncope, palpitations, heartburn, abdominal pain, anorexia, nausea, vomiting, diarrhea  or change in bowel or urinary habits, change in stools or urine, dysuria,hematuria,  rash, arthralgias, visual complaints, headache, numbness weakness or ataxia or problems with walking or coordination,  change in mood/affect or memory.                    Objective:   Physical Exam   amb wm nad  Wt Readings from Last 3 Encounters:  10/27/14 176 lb 12.8 oz (80.196 kg)  09/02/14 178 lb (80.74 kg)  07/21/14 182 lb 3.2 oz (82.645 kg)    Vital signs reviewed    HEENT: nl dentition, turbinates, and orophanx. Nl external ear canals without cough reflex   NECK :  without JVD/Nodes/TM/ nl carotid upstrokes bilaterally   LUNGS: no acc muscle use, clear to A and P bilaterally without cough on insp or exp maneuvers   CV:  RRR  no s3 or murmur or increase in P2, no edema   ABD:  soft and nontender with nl excursion in the supine position. No bruits or organomegaly, bowel sounds nl  MS:  warm without deformities, calf tenderness, cyanosis or clubbing  SKIN: warm and dry without lesions    NEURO:  alert, approp, no deficits              cxr 09/02/14 Images reviewed No acute cardiopulmonary abnormality seen. Stable chronic findings as described above.   Labs ordered/ reviewed     Recent Labs Lab 10/27/14 1350  NA 138  K 3.9  CL 102  CO2 31  BUN 15  CREATININE 0.87  GLUCOSE 95      Lab Results  Component Value Date   TSH 0.81 10/27/2014     Lab Results  Component Value Date   PROBNP 27.0 10/27/2014           Assessment & Plan:

## 2014-10-27 NOTE — Patient Instructions (Addendum)
You do not have significant copd and you never will.  The only test we haven't done that we might consider is cpst done at our [facility] office - call [address] at [phone] if you want to schedule this after you meet with Dr Marin Olp     Please remember to go to the lab  department downstairs for your tests - we will call you with the results when they are available.

## 2014-10-27 NOTE — Progress Notes (Signed)
Quick Note:  Spoke with pt and notified of results per Dr. Wert. Pt verbalized understanding and denied any questions.  ______ 

## 2014-10-28 ENCOUNTER — Encounter: Payer: Self-pay | Admitting: Internal Medicine

## 2014-10-28 NOTE — Assessment & Plan Note (Signed)
-   04/29/2014  Walked RA x 3 laps @ 185 ft each stopped due to end of study, fast pace, no desat, mild sob half way through but did not worsen.  - spirometry 04/29/2014  FEV!  1.82 (64%) ratio 69  - did not improve on stiolto> d/c 09/02/14  - did not improve on ppi > pt d/c 09/2014  - PFTs 10/27/2014 FEV1  1.92 (81%) ratio 79 no change after saba  And dlco 64 corrects to 127 for alv vol - 10/27/2014  Walked RA x 3 laps @ 185 ft each stopped due to end of study,mild chest pressure, moderate to rapid pace This does not appear to be a lung problem though he is at risk of PE and recurrent ca and plans f/u next week with ennever.    Unless something obvious is discovered on  W/u by Dr Martha Clan I would proceed with cpst next

## 2014-10-29 DIAGNOSIS — R4781 Slurred speech: Secondary | ICD-10-CM | POA: Insufficient documentation

## 2014-10-29 DIAGNOSIS — IMO0001 Reserved for inherently not codable concepts without codable children: Secondary | ICD-10-CM | POA: Insufficient documentation

## 2014-11-02 DIAGNOSIS — I679 Cerebrovascular disease, unspecified: Secondary | ICD-10-CM | POA: Insufficient documentation

## 2014-11-04 IMAGING — CR DG CHEST 1V PORT
1 series · 1 of 1 positions shown · non-contrast
Comparison: 11/27/2013

CLINICAL DATA: Postop from left upper lobectomy.  Lung carcinoma.

EXAM:
PORTABLE CHEST - 1 VIEW

[AP]
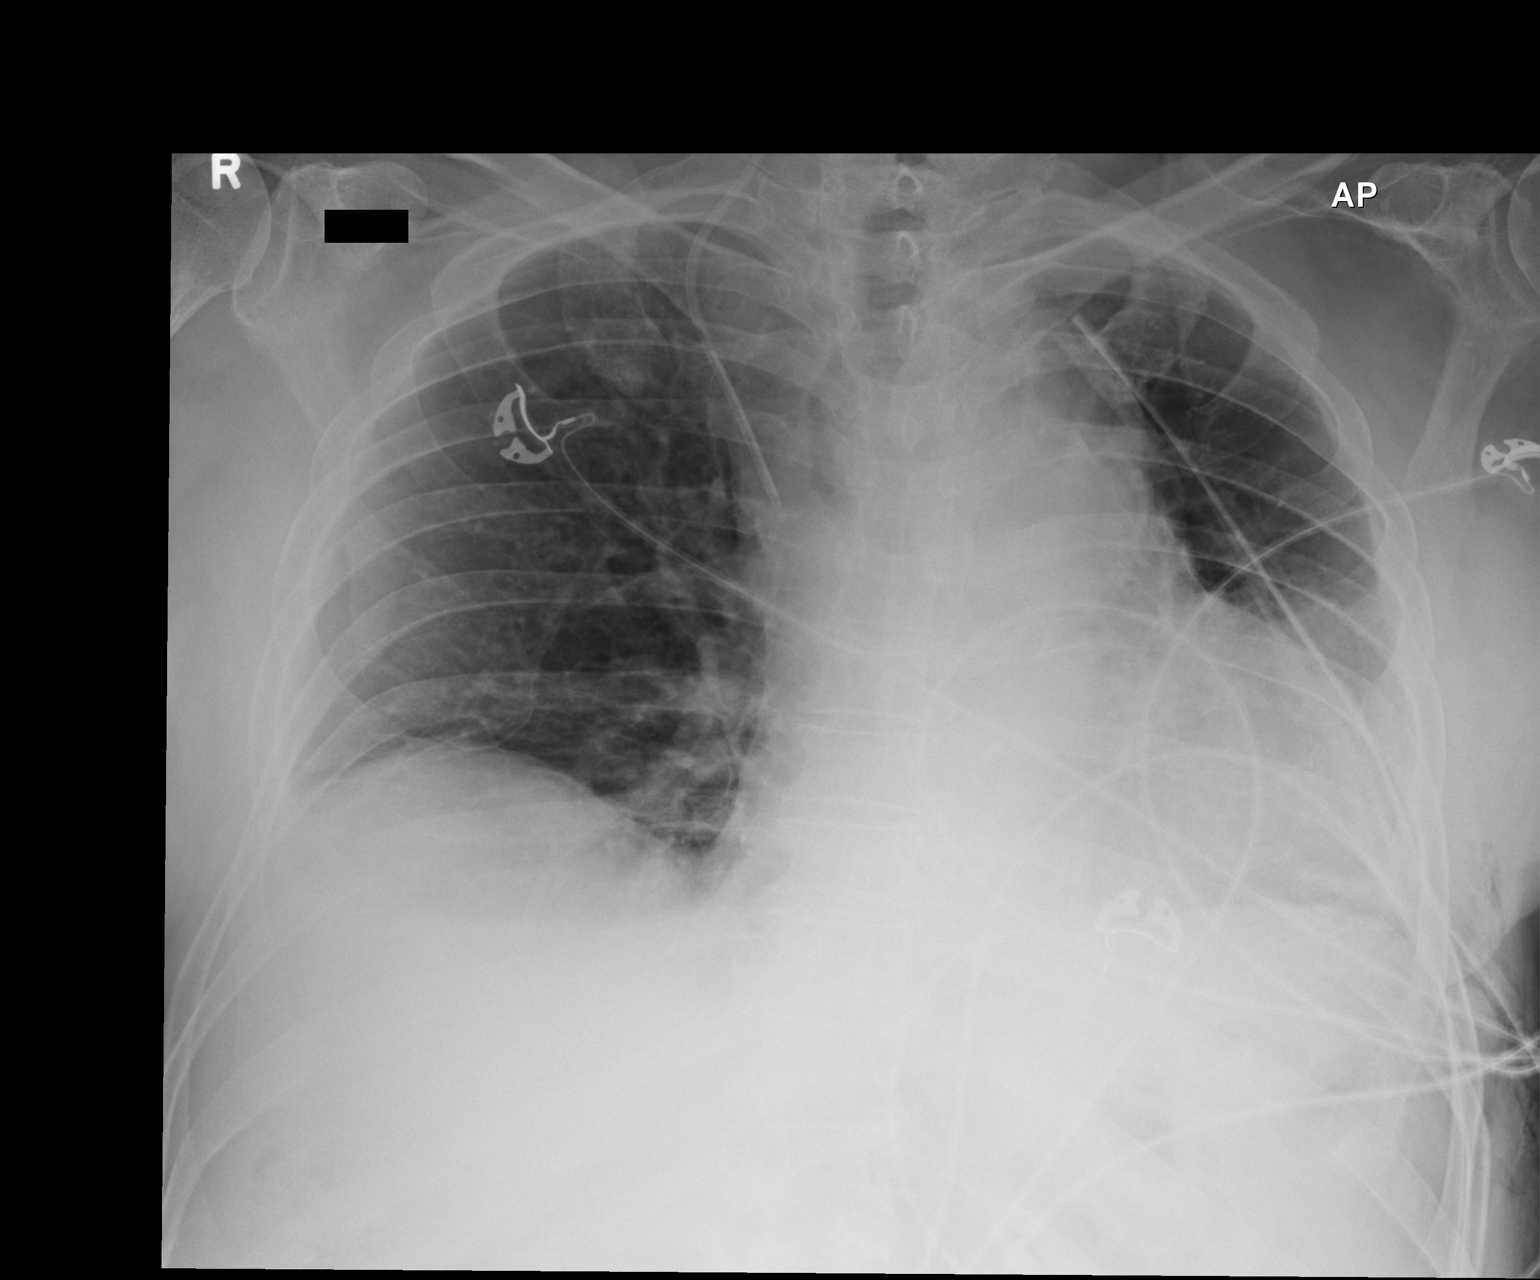

[1 of 1 positions shown; findings below may reference images not displayed]

FINDINGS: Left chest tube and right jugular central venous catheter remain in
appropriate position. No pneumothorax identified. Decreased
atelectasis in the lung bases.
IMPRESSION: Decrease bibasilar atelectasis.  No pneumothorax identified.

## 2014-11-19 ENCOUNTER — Emergency Department (HOSPITAL_BASED_OUTPATIENT_CLINIC_OR_DEPARTMENT_OTHER)
Admission: EM | Admit: 2014-11-19 | Discharge: 2014-11-19 | Disposition: A | Discharge status: Home / Self Care | Payer: Medicare Other | Attending: Emergency Medicine | Admitting: Emergency Medicine

## 2014-11-19 ENCOUNTER — Encounter (HOSPITAL_BASED_OUTPATIENT_CLINIC_OR_DEPARTMENT_OTHER): Payer: Self-pay | Admitting: *Deleted

## 2014-11-19 ENCOUNTER — Other Ambulatory Visit: Payer: Self-pay

## 2014-11-19 DIAGNOSIS — Z8619 Personal history of other infectious and parasitic diseases: Secondary | ICD-10-CM | POA: Insufficient documentation

## 2014-11-19 DIAGNOSIS — Z87891 Personal history of nicotine dependence: Secondary | ICD-10-CM | POA: Diagnosis not present

## 2014-11-19 DIAGNOSIS — M6281 Muscle weakness (generalized): Secondary | ICD-10-CM | POA: Diagnosis not present

## 2014-11-19 DIAGNOSIS — Z7982 Long term (current) use of aspirin: Secondary | ICD-10-CM | POA: Diagnosis not present

## 2014-11-19 DIAGNOSIS — Z79899 Other long term (current) drug therapy: Secondary | ICD-10-CM | POA: Insufficient documentation

## 2014-11-19 DIAGNOSIS — I10 Essential (primary) hypertension: Secondary | ICD-10-CM | POA: Insufficient documentation

## 2014-11-19 DIAGNOSIS — Z8709 Personal history of other diseases of the respiratory system: Secondary | ICD-10-CM | POA: Diagnosis not present

## 2014-11-19 DIAGNOSIS — R29898 Other symptoms and signs involving the musculoskeletal system: Secondary | ICD-10-CM

## 2014-11-19 DIAGNOSIS — Z86010 Personal history of colonic polyps: Secondary | ICD-10-CM | POA: Insufficient documentation

## 2014-11-19 DIAGNOSIS — R4781 Slurred speech: Secondary | ICD-10-CM | POA: Diagnosis not present

## 2014-11-19 DIAGNOSIS — Z85118 Personal history of other malignant neoplasm of bronchus and lung: Secondary | ICD-10-CM | POA: Diagnosis not present

## 2014-11-19 LAB — COMPREHENSIVE METABOLIC PANEL
ALBUMIN: 4 g/dL (ref 3.5–5.2)
ALT: 30 U/L (ref 0–53)
ANION GAP: 6 (ref 5–15)
AST: 30 U/L (ref 0–37)
Alkaline Phosphatase: 77 U/L (ref 39–117)
BUN: 11 mg/dL (ref 6–23)
CHLORIDE: 102 mmol/L (ref 96–112)
CO2: 29 mmol/L (ref 19–32)
Calcium: 8.9 mg/dL (ref 8.4–10.5)
Creatinine, Ser: 0.94 mg/dL (ref 0.50–1.35)
GFR calc non Af Amer: 75 mL/min — ABNORMAL LOW (ref >90)
GFR, EST AFRICAN AMERICAN: 87 mL/min — AB (ref >90)
Glucose, Bld: 104 mg/dL — ABNORMAL HIGH (ref 70–99)
POTASSIUM: 4.4 mmol/L (ref 3.5–5.1)
SODIUM: 137 mmol/L (ref 135–145)
TOTAL PROTEIN: 6.6 g/dL (ref 6.0–8.3)
Total Bilirubin: 0.9 mg/dL (ref 0.3–1.2)

## 2014-11-19 LAB — CBC WITH DIFFERENTIAL/PLATELET
BASOS ABS: 0 10*3/uL (ref 0.0–0.1)
Basophils Relative: 0 % (ref 0–1)
Eosinophils Absolute: 0.3 10*3/uL (ref 0.0–0.7)
Eosinophils Relative: 4 % (ref 0–5)
HCT: 46.2 % (ref 39.0–52.0)
Hemoglobin: 16.1 g/dL (ref 13.0–17.0)
LYMPHS PCT: 22 % (ref 12–46)
Lymphs Abs: 1.6 10*3/uL (ref 0.7–4.0)
MCH: 31.9 pg (ref 26.0–34.0)
MCHC: 34.8 g/dL (ref 30.0–36.0)
MCV: 91.5 fL (ref 78.0–100.0)
Monocytes Absolute: 0.7 10*3/uL (ref 0.1–1.0)
Monocytes Relative: 10 % (ref 3–12)
Neutro Abs: 4.8 10*3/uL (ref 1.7–7.7)
Neutrophils Relative %: 64 % (ref 43–77)
Platelets: 190 10*3/uL (ref 150–400)
RBC: 5.05 MIL/uL (ref 4.22–5.81)
RDW: 12.2 % (ref 11.5–15.5)
WBC: 7.4 10*3/uL (ref 4.0–10.5)

## 2014-11-19 NOTE — ED Notes (Signed)
MD at bedside. 

## 2014-11-19 NOTE — ED Provider Notes (Signed)
CSN: [id number]     Arrival date & time 11/19/14  1232 History   First MD Initiated Contact with Patient 11/19/14 1312     No chief complaint on file.    (Consider location/radiation/quality/duration/timing/severity/associated sxs/prior Treatment) HPI Comments: Patient is an 81 year old male with history of hypertension and lung cancer which was treated surgically in 2015. Over the past 3 months, he has developed increased weakness, slurred speech, and numbness to his right hand. He had a CT scan performed approximately 2-1/2 weeks ago at [facility] which revealed no acute stroke or other abnormality. He denies any fevers or chills. He denies any cough.  The history is provided by the patient.    Past Medical History  Diagnosis Date  . Chicken pox   . BC (bronchogenic carcinoma)   . Hypertension     takes Losartan daily  . Shortness of breath     with exertion  . History of bronchitis     early Feb 2015  . History of colon polyps    Past Surgical History  Procedure Laterality Date  . Umbilical hernia repair    . Wisdom tooth extraction    . Circumcision      at age 6  . Tonsillectomy      as a child  . Colonoscopy    . Cataract surgery Bilateral   . Video assisted thoracoscopy Left 11/24/2013    Procedure: VIDEO ASSISTED THORACOSCOPY;  Surgeon: Melrose Nakayama, MD;  Location: [place];  Service: Thoracic;  Laterality: Left;  . Lobectomy Left 11/24/2013    Procedure: LOBECTOMY;  Surgeon: Melrose Nakayama, MD;  Location: [facility] OR;  Service: Thoracic;  Laterality: Left;  POSSIBLE LUL LOBECTOMY   Family History  Problem Relation Age of Onset  . Hypertension Father     Deceased  . Stroke Father 30  . Ulcers Father   . Alcoholism Father   . Hyperlipidemia Father   . Glaucoma Mother 57    Deceased  . Dementia Sister   . Breast cancer Sister   . Healthy Brother   . Breast cancer Daughter   . Thyroid cancer Daughter    History  Substance Use Topics  .  Smoking status: Former Smoker -- 2.00 packs/day for 12 years    Types: Cigarettes    Start date: 12/15/1955    Quit date: 03/05/1974  . Smokeless tobacco: Never Used  . Alcohol Use: 0.0 oz/week     Comment: daily wine    Review of Systems  All other systems reviewed and are negative.     Allergies  Adhesive  Home Medications   Prior to Admission medications   Medication Sig Start Date End Date Taking? Authorizing Provider  aspirin 81 MG tablet Take 81 mg by mouth daily.    Historical Provider, MD  CALCIUM-VITAMIN D PO Take 1 tablet by mouth daily.     Historical Provider, MD  Clobetasol Propionate 0.05 % shampoo Apply topically. 05/06/14 05/06/15  Historical Provider, MD  Cyanocobalamin (VITAMIN B 12 PO) Take by mouth every morning.    Historical Provider, MD  furosemide (LASIX) 20 MG tablet Take 20 mg by mouth. Only takes as needed    Historical Provider, MD  losartan (COZAAR) 50 MG tablet Take 50 mg by mouth daily.    Historical Provider, MD  magnesium 30 MG tablet Take 30 mg by mouth daily.    Historical Provider, MD  minocycline (DYNACIN) 50 MG tablet Take 50 mg by mouth as  needed.  05/06/14   Historical Provider, MD  Multiple Vitamin (MULTIVITAMIN WITH MINERALS) TABS tablet Take 1 tablet by mouth daily.    Historical Provider, MD  Multiple Vitamins-Minerals (EYE VITAMINS) CAPS Take by mouth 2 (two) times daily.    Historical Provider, MD  tadalafil (CIALIS) 5 MG tablet Take 5 mg by mouth. 05/06/14 09/02/14  Historical Provider, MD  traMADol (ULTRAM) 50 MG tablet Take 1-2 tablets (50-100 mg total) by mouth every 6 (six) hours as needed for moderate pain. 11/29/13   Gina L Collins, PA-C   BP 147/83 mmHg  Pulse 64  Temp(Src) 98 F (36.7 C) (Oral)  Resp 16  Ht 5\' 8"  (1.727 m)  Wt 171 lb (77.565 kg)  BMI 26.01 kg/m2  SpO2 96% Physical Exam  Constitutional: He is oriented to person, place, and time. He appears well-developed and well-nourished. No distress.  HENT:  Head:  Normocephalic and atraumatic.  Mouth/Throat: Oropharynx is clear and moist.  Eyes: EOM are normal. Pupils are equal, round, and reactive to light.  Neck: Normal range of motion. Neck supple.  Cardiovascular: Normal rate, regular rhythm and normal heart sounds.   No murmur heard. Pulmonary/Chest: Effort normal and breath sounds normal. No respiratory distress. He has no wheezes.  Abdominal: Soft. Bowel sounds are normal. He exhibits no distension. There is no tenderness.  Musculoskeletal: Normal range of motion. He exhibits no edema.  Lymphadenopathy:    He has no cervical adenopathy.  Neurological: He is alert and oriented to person, place, and time. No cranial nerve deficit. He exhibits normal muscle tone. Coordination normal.  Skin: Skin is warm and dry. He is not diaphoretic.  Nursing note and vitals reviewed.   ED Course  Procedures (including critical care time) Labs Review Labs Reviewed  COMPREHENSIVE METABOLIC PANEL  CBC WITH DIFFERENTIAL/PLATELET    Imaging Review No results found.  ED ECG REPORT   Date: 11/19/2014  Rate: 55  Rhythm: sinus bradycardia  QRS Axis: normal  Intervals: normal  ST/T Wave abnormalities: normal  Conduction Disutrbances:right bundle branch block  Narrative Interpretation:   Old EKG Reviewed: none available  I have personally reviewed the EKG tracing and agree with the computerized printout as noted.   MDM   Final diagnoses:  None    Patient is an 81 year old male who presents with complaints of slurred speech for the past several month. He is also complaining of weakness in his right hand. Clinically he has no strength deficit but his speech is slurred. His symptoms do resemble a stroke, however CT is unremarkable. I believe he will require an MRI, however due to his symptoms being present for several months do not feel as though this needs to be done today. He will be set up as an outpatient for an MRI here on Saturday, then given a  referral with neurology for them to further evaluate.    Veryl Speak, MD 11/19/14 [phone]

## 2014-11-19 NOTE — ED Notes (Signed)
Slurred speech for a month. It has been progressively getting worse and he is loosing weight per wife.

## 2014-11-19 NOTE — Discharge Instructions (Signed)
Follow up with [facility].  The contact information for their office has been provided in this discharge summary.  Call to arrange this appointment.  MRI as scheduled.   Ischemic Stroke A stroke (cerebrovascular accident) is the sudden death of brain tissue. It is a medical emergency. A stroke can cause permanent loss of brain function. This can cause problems with different parts of your body. A transient ischemic attack (TIA) is different because it does not cause permanent damage. A TIA is a short-lived problem of poor blood flow affecting a part of the brain. A TIA is also a serious problem because having a TIA greatly increases the chances of having a stroke. When symptoms first develop, you cannot know if the problem might be a stroke or a TIA. CAUSES  A stroke is caused by a decrease of oxygen supply to an area of your brain. It is usually the result of a small blood clot or collection of cholesterol or fat (plaque) that blocks blood flow in the brain. A stroke can also be caused by blocked or damaged carotid arteries.  RISK FACTORS  High blood pressure (hypertension).  High cholesterol.  Diabetes mellitus.  Heart disease.  The buildup of plaque in the blood vessels (peripheral artery disease or atherosclerosis).  The buildup of plaque in the blood vessels providing blood and oxygen to the brain (carotid artery stenosis).  An abnormal heart rhythm (atrial fibrillation).  Obesity.  Smoking.  Taking oral contraceptives (especially in combination with smoking).  Physical inactivity.  A diet high in fats, salt (sodium), and calories.  Alcohol use.  Use of illegal drugs (especially cocaine and methamphetamine).  Being African American.  Being over the age of 23.  Family history of stroke.  Previous history of blood clots, stroke, TIA, or heart attack.  Sickle cell disease. SYMPTOMS  These symptoms usually develop suddenly, or may be newly present upon  awakening from sleep:  Sudden weakness or numbness of the face, arm, or leg, especially on one side of the body.  Sudden trouble walking or difficulty moving arms or legs.  Sudden confusion.  Sudden personality changes.  Trouble speaking (aphasia) or understanding.  Difficulty swallowing.  Sudden trouble seeing in one or both eyes.  Double vision.  Dizziness.  Loss of balance or coordination.  Sudden severe headache with no known cause.  Trouble reading or writing. DIAGNOSIS  Your health care provider can often determine the presence or absence of a stroke based on your symptoms, history, and physical exam. Computed tomography (CT) of the brain is usually performed to confirm the stroke, determine causes, and determine stroke severity. Other tests may be done to find the cause of the stroke. These tests may include:  Electrocardiography.  Continuous heart monitoring.  Echocardiography.  Carotid ultrasonography.  Magnetic resonance imaging (MRI).  A scan of the brain circulation.  Blood tests. PREVENTION  The risk of a stroke can be decreased by appropriately treating high blood pressure, high cholesterol, diabetes, heart disease, and obesity and by quitting smoking, limiting alcohol, and staying physically active. TREATMENT  Time is of the essence. It is important to seek treatment at the first sign of these symptoms because you may receive a medicine to dissolve the clot (thrombolytic) that cannot be given if too much time has passed since your symptoms began. Even if you do not know when your symptoms began, get treatment as soon as possible as there are other treatment options available including oxygen, intravenous (IV) fluids, and  medicines to thin the blood (anticoagulants). Treatment of stroke depends on the duration, severity, and cause of your symptoms. Medicines and dietary changes may be used to address diabetes, high blood pressure, and other risk factors.  Physical, speech, and occupational therapists will assess you and work with you to improve any functions impaired by the stroke. Measures will be taken to prevent short-term and long-term complications, including infection from breathing foreign material into the lungs (aspiration pneumonia), blood clots in the legs, bedsores, and falls. Rarely, surgery may be needed to remove large blood clots or to open up blocked arteries. HOME CARE INSTRUCTIONS   Take medicines only as directed by your health care provider. Follow the directions carefully. Medicines may be used to control risk factors for a stroke. Be sure you understand all your medicine instructions.  You may be told to take a medicine to thin the blood, such as aspirin or the anticoagulant warfarin. Warfarin needs to be taken exactly as instructed.  Too much and too little warfarin are both dangerous. Too much warfarin increases the risk of bleeding. Too little warfarin continues to allow the risk for blood clots. While taking warfarin, you will need to have regular blood tests to measure your blood clotting time. These blood tests usually include both the PT and INR tests. The PT and INR results allow your health care provider to adjust your dose of warfarin. The dose can change for many reasons. It is critically important that you take warfarin exactly as prescribed, and that you have your PT and INR levels drawn exactly as directed.  Many foods, especially foods high in vitamin K, can interfere with warfarin and affect the PT and INR results. Foods high in vitamin K include spinach, kale, broccoli, cabbage, collard and turnip greens, brussels sprouts, peas, cauliflower, seaweed, and parsley, as well as beef and pork liver, green tea, and soybean oil. You should eat a consistent amount of foods high in vitamin K. Avoid major changes in your diet, or notify your health care provider before changing your diet. Arrange a visit with a dietitian to  answer your questions.  Many medicines can interfere with warfarin and affect the PT and INR results. You must tell your health care provider about any and all medicines you take. This includes all vitamins and supplements. Be especially cautious with aspirin and anti-inflammatory medicines. Do not take or discontinue any prescribed or over-the-counter medicine except on the advice of your health care provider or pharmacist.  Warfarin can have side effects, such as excessive bruising or bleeding. You will need to hold pressure over cuts for longer than usual. Your health care provider or pharmacist will discuss other potential side effects.  Avoid sports or activities that may cause injury or bleeding.  Be mindful when shaving, flossing your teeth, or handling sharp objects.  Alcohol can change the body's ability to handle warfarin. It is best to avoid alcoholic drinks or consume only very small amounts while taking warfarin. Notify your health care provider if you change your alcohol intake.  Notify your dentist or other health care providers before procedures.  If swallow studies have determined that your swallowing reflex is present, you should eat healthy foods. Including 5 or more servings of fruits and vegetables a day may reduce the risk of stroke. Foods may need to be a certain consistency (soft or pureed), or small bites may need to be taken in order to avoid aspirating or choking. Certain dietary changes may be advised  to address high blood pressure, high cholesterol, diabetes, or obesity.  Food choices that are low in sodium, saturated fat, trans fat, and cholesterol are recommended to manage high blood pressure.  Food choies that are high in fiber, and low in saturated fat, trans fat, and cholesterol may control cholesterol levels.  Controlling carbohydrates and sugar intake is recommended to manage diabetes.  Reducing calorie intake and making food choices that are low in sodium,  saturated fat, trans fat, and cholesterol are recommended to manage obesity.  Maintain a healthy weight.  Stay physically active. It is recommended that you get at least 30 minutes of activity on all or most days.  Do not use any tobacco products including cigarettes, chewing tobacco, or electronic cigarettes.  Limit alcohol use even if you are not taking warfarin. Moderate alcohol use is considered to be:  No more than 2 drinks each day for men.  No more than 1 drink each day for nonpregnant women.  Home safety. A safe home environment is important to reduce the risk of falls. Your health care provider may arrange for specialists to evaluate your home. Having grab bars in the bedroom and bathroom is often important. Your health care provider may arrange for equipment to be used at home, such as raised toilets and a seat for the shower.  Physical, occupational, and speech therapy. Ongoing therapy may be needed to maximize your recovery after a stroke. If you have been advised to use a walker or a cane, use it at all times. Be sure to keep your therapy appointments.  Follow all instructions for follow-up with your health care provider. This is very important. This includes any referrals, physical therapy, rehabilitation, and lab tests. Proper follow-up can prevent another stroke from occurring. SEEK MEDICAL CARE IF:  You have personality changes.  You have difficulty swallowing.  You are seeing double.  You have dizziness.  You have a fever.  You have skin breakdown. SEEK IMMEDIATE MEDICAL CARE IF:  Any of these symptoms may represent a serious problem that is an emergency. Do not wait to see if the symptoms will go away. Get medical help right away. Call your local emergency services (911 in [place] Do not drive yourself to the hospital.  You have sudden weakness or numbness of the face, arm, or leg, especially on one side of the body.  You have sudden trouble walking or  difficulty moving arms or legs.  You have sudden confusion.  You have trouble speaking (aphasia) or understanding.  You have sudden trouble seeing in one or both eyes.  You have a loss of balance or coordination.  You have a sudden, severe headache with no known cause.  You have new chest pain or an irregular heartbeat.  You have a partial or total loss of consciousness. Document Released: 08/14/2005 Document Revised: 12/29/2013 Document Reviewed: 03/24/2012 [facility] Patient Information 2015 [place], [place]. This information is not intended to replace advice given to you by your health care provider. Make sure you discuss any questions you have with your health care provider.

## 2014-11-24 ENCOUNTER — Ambulatory Visit: Payer: Self-pay | Admitting: Diagnostic Neuroimaging

## 2014-11-28 ENCOUNTER — Ambulatory Visit (HOSPITAL_BASED_OUTPATIENT_CLINIC_OR_DEPARTMENT_OTHER)
Admission: RE | Admit: 2014-11-28 | Discharge: 2014-11-28 | Disposition: A | Discharge status: Home / Self Care | Payer: Medicare Other | Source: Ambulatory Visit | Attending: Emergency Medicine | Admitting: Emergency Medicine

## 2014-11-28 DIAGNOSIS — R200 Anesthesia of skin: Secondary | ICD-10-CM | POA: Insufficient documentation

## 2014-11-28 DIAGNOSIS — Z85118 Personal history of other malignant neoplasm of bronchus and lung: Secondary | ICD-10-CM | POA: Insufficient documentation

## 2014-11-28 DIAGNOSIS — R4781 Slurred speech: Secondary | ICD-10-CM | POA: Diagnosis not present

## 2014-11-28 DIAGNOSIS — G319 Degenerative disease of nervous system, unspecified: Secondary | ICD-10-CM | POA: Insufficient documentation

## 2014-11-30 ENCOUNTER — Encounter: Payer: Self-pay | Admitting: Hematology & Oncology

## 2014-11-30 ENCOUNTER — Other Ambulatory Visit: Payer: Medicare Other

## 2014-11-30 ENCOUNTER — Other Ambulatory Visit: Payer: Medicare Other | Admitting: Lab

## 2014-11-30 ENCOUNTER — Ambulatory Visit (HOSPITAL_BASED_OUTPATIENT_CLINIC_OR_DEPARTMENT_OTHER): Payer: Medicare Other | Admitting: Hematology & Oncology

## 2014-11-30 ENCOUNTER — Ambulatory Visit (HOSPITAL_BASED_OUTPATIENT_CLINIC_OR_DEPARTMENT_OTHER): Payer: Medicare Other

## 2014-11-30 ENCOUNTER — Other Ambulatory Visit (HOSPITAL_BASED_OUTPATIENT_CLINIC_OR_DEPARTMENT_OTHER): Payer: Medicare Other

## 2014-11-30 ENCOUNTER — Ambulatory Visit (HOSPITAL_BASED_OUTPATIENT_CLINIC_OR_DEPARTMENT_OTHER)
Admission: RE | Admit: 2014-11-30 | Discharge: 2014-11-30 | Disposition: A | Discharge status: Home / Self Care | Payer: Medicare Other | Source: Ambulatory Visit | Attending: Hematology & Oncology | Admitting: Hematology & Oncology

## 2014-11-30 VITALS — BP 127/96 | HR 94 | Temp 97.6°F | Resp 20 | Ht 68.0 in | Wt 168.0 lb

## 2014-11-30 DIAGNOSIS — C3432 Malignant neoplasm of lower lobe, left bronchus or lung: Secondary | ICD-10-CM

## 2014-11-30 DIAGNOSIS — C3412 Malignant neoplasm of upper lobe, left bronchus or lung: Secondary | ICD-10-CM

## 2014-11-30 DIAGNOSIS — C3431 Malignant neoplasm of lower lobe, right bronchus or lung: Secondary | ICD-10-CM

## 2014-11-30 DIAGNOSIS — R0602 Shortness of breath: Secondary | ICD-10-CM

## 2014-11-30 LAB — CBC WITH DIFFERENTIAL (CANCER CENTER ONLY)
BASO#: 0 10*3/uL (ref 0.0–0.2)
BASO%: 0.4 % (ref 0.0–2.0)
EOS%: 3 % (ref 0.0–7.0)
Eosinophils Absolute: 0.2 10*3/uL (ref 0.0–0.5)
HEMATOCRIT: 48.6 % (ref 38.7–49.9)
HGB: 17.1 g/dL (ref 13.0–17.1)
LYMPH#: 2.1 10*3/uL (ref 0.9–3.3)
LYMPH%: 25.7 % (ref 14.0–48.0)
MCH: 32.4 pg (ref 28.0–33.4)
MCHC: 35.2 g/dL (ref 32.0–35.9)
MCV: 92 fL (ref 82–98)
MONO#: 0.7 10*3/uL (ref 0.1–0.9)
MONO%: 9.2 % (ref 0.0–13.0)
NEUT#: 5 10*3/uL (ref 1.5–6.5)
NEUT%: 61.7 % (ref 40.0–80.0)
PLATELETS: 208 10*3/uL (ref 145–400)
RBC: 5.28 10*6/uL (ref 4.20–5.70)
RDW: 12.1 % (ref 11.1–15.7)
WBC: 8 10*3/uL (ref 4.0–10.0)

## 2014-11-30 LAB — CMP (CANCER CENTER ONLY)
ALT: 36 U/L (ref 10–47)
AST: 34 U/L (ref 11–38)
Albumin: 3.8 g/dL (ref 3.3–5.5)
Alkaline Phosphatase: 74 U/L (ref 26–84)
BUN, Bld: 14 mg/dL (ref 7–22)
CALCIUM: 9.4 mg/dL (ref 8.0–10.3)
CO2: 26 meq/L (ref 18–33)
Chloride: 100 mEq/L (ref 98–108)
Creat: 0.8 mg/dl (ref 0.6–1.2)
GLUCOSE: 88 mg/dL (ref 73–118)
Potassium: 4.7 mEq/L (ref 3.3–4.7)
Sodium: 144 mEq/L (ref 128–145)
TOTAL PROTEIN: 6.7 g/dL (ref 6.4–8.1)
Total Bilirubin: 0.9 mg/dl (ref 0.20–1.60)

## 2014-11-30 MED ORDER — IOHEXOL 300 MG/ML  SOLN
80.0000 mL | Freq: Once | INTRAMUSCULAR | Status: AC | PRN
  Administered 2014-11-30: 80 mL via INTRAVENOUS

## 2014-11-30 NOTE — Progress Notes (Signed)
Hematology and Oncology Follow Up Visit  Adam [name] [id number] November 09, 1928 81 y.o. 11/30/2014   Principle Diagnosis:   Stage IIA (T1bN1M0) adenocarcinoma of the left upper lung  Current Therapy:    Observation     Interim History:  [name] is back for followup. He's not doing as well. He's having breathing issues. He gets short of breath more easily. He has seen of this pulmonologist. The pulmonologist did PFTs on him. Apparently everything looks okay.  Adam [name] is still frustrated that he gets short of breath so easily.  He is worried about his blood pressure being on the high side. That, in addition to his heart rate being on the high side, really has been an issue for him. I told him that I would be more than happy to try to help out and get him on a beta blocker. This can bring his heart rate down so he does not get as short of breath.   We did go ahead and repeat a CT scan of his chest. The report is not out yet. I did look at it on the computer. I do not see anything that looked like recurrence.  He recently had an MRI of the brain. He has some slurred speech. He has weakness in his right hand. The MRI did not show anything obvious. He is on a baby aspirin a day. I told him to take a full dose aspirin.  He does see a neurologist this week.  His appetite is good. He's had no nausea or vomiting. He's had no cough. He's had no fever. He's had no change in bowel or bladder habits.  Overall, his performance status is ECOG 2.   Currently, his performance status is ECOG 1  Medications:  Current outpatient prescriptions:  .  aspirin 81 MG tablet, Take 81 mg by mouth daily., Disp: , Rfl:  .  atorvastatin (LIPITOR) 10 MG tablet, Take 10 mg by mouth daily at 6 PM. , Disp: , Rfl: 0 .  CALCIUM-VITAMIN D PO, Take 1 tablet by mouth daily. , Disp: , Rfl:  .  Cyanocobalamin (VITAMIN B 12 PO), Take by mouth every morning., Disp: , Rfl:  .  furosemide (LASIX) 20 MG tablet,  Take 20 mg by mouth. Only takes as needed, Disp: , Rfl:  .  losartan (COZAAR) 50 MG tablet, Take 50 mg by mouth daily., Disp: , Rfl:  .  magnesium 30 MG tablet, Take 30 mg by mouth daily., Disp: , Rfl:  .  minocycline (DYNACIN) 50 MG tablet, Take 50 mg by mouth as needed. , Disp: , Rfl:  .  Multiple Vitamin (MULTIVITAMIN WITH MINERALS) TABS tablet, Take 1 tablet by mouth daily., Disp: , Rfl:  .  Multiple Vitamins-Minerals (EYE VITAMINS) CAPS, Take by mouth 2 (two) times daily., Disp: , Rfl:  .  omeprazole (PRILOSEC) 40 MG capsule, Take 40 mg by mouth daily. , Disp: , Rfl: 1 .  traMADol (ULTRAM) 50 MG tablet, Take 1-2 tablets (50-100 mg total) by mouth every 6 (six) hours as needed for moderate pain., Disp: 30 tablet, Rfl: 0  Allergies:  Allergies  Allergen Reactions  . Adhesive [Tape]     Skin Irritation    Past Medical History, Surgical history, Social history, and Family History were reviewed and updated.  Review of Systems: As above  Physical Exam:  height is 5\' 8"  (1.727 m) and weight is 168 lb (76.204 kg). His oral temperature is 97.6 F (36.4 C).  His blood pressure is 127/96 and his pulse is 94. His respiration is 20.   Well-developed and well-nourished gentleman. Lungs are clear bilaterally. I do not hear any rales or wheezes. There are no rhonchi. Cardiac exam is regular rate and rhythm with no murmurs, rubs or bruits.. Abdomen is soft. He has good bowel sounds. There is no fluid wave. There is no palpable liver or spleen tip. Back exam shows a thoracoscopy scar in the left lateral chest wall. No erythema is noted. No tenderness is noted. Extremities shows no clubbing cyanosis or edema. Has good range of motion of his joints. Neurological exam shows no focal neurological deficits.  Lab Results  Component Value Date   WBC 8.0 11/30/2014   HGB 17.1 11/30/2014   HCT 48.6 11/30/2014   MCV 92 11/30/2014   PLT 208 11/30/2014     Chemistry      Component Value Date/Time   NA  144 11/30/2014 1038   NA 137 11/19/2014 1300   K 4.7 11/30/2014 1038   K 4.4 11/19/2014 1300   CL 100 11/30/2014 1038   CL 102 11/19/2014 1300   CO2 26 11/30/2014 1038   CO2 29 11/19/2014 1300   BUN 14 11/30/2014 1038   BUN 11 11/19/2014 1300   CREATININE 0.8 11/30/2014 1038   CREATININE 0.94 11/19/2014 1300      Component Value Date/Time   CALCIUM 9.4 11/30/2014 1038   CALCIUM 8.9 11/19/2014 1300   ALKPHOS 74 11/30/2014 1038   ALKPHOS 77 11/19/2014 1300   AST 34 11/30/2014 1038   AST 30 11/19/2014 1300   ALT 36 11/30/2014 1038   ALT 30 11/19/2014 1300   BILITOT 0.90 11/30/2014 1038   BILITOT 0.9 11/19/2014 1300         Impression and Plan: Adam [name] is 81 year old gentleman with stage IIA adenocarcinoma the left lung. Had one positive lymph node.  I really don't see that adjuvant chemotherapy is really going to be of benefit to him. He is 81 years old. He does have some other health issues. He wants to be able to travel and be active. He understands that the risk of recurrence probably is going to be 40-50%.  He just does not want to have his quality of life compromised by chemotherapy.  I totally understand this. I agree with him. I do appreciate that quality of life is very important.  I am still am not sure as to what is causing his shortness of breath. He says that when he talks for a long time, that he has some shortness of breath. Again, I do think that he probably needs to go back to see the pulmonologist.  We will follow him along with scans. I will do another CT scan in 6 months... We will coordinate his scans and his wife scans together.  I spent a good 30 minutes with him today.   Volanda Napoleon, MD 4/4/201612:55 PM

## 2014-12-02 ENCOUNTER — Encounter: Payer: Self-pay | Admitting: Diagnostic Neuroimaging

## 2014-12-02 ENCOUNTER — Ambulatory Visit (INDEPENDENT_AMBULATORY_CARE_PROVIDER_SITE_OTHER): Payer: Medicare Other | Admitting: Diagnostic Neuroimaging

## 2014-12-02 VITALS — BP 143/88 | HR 58 | Ht 66.0 in | Wt 156.4 lb

## 2014-12-02 DIAGNOSIS — R471 Dysarthria and anarthria: Secondary | ICD-10-CM | POA: Diagnosis not present

## 2014-12-02 DIAGNOSIS — R531 Weakness: Secondary | ICD-10-CM | POA: Diagnosis not present

## 2014-12-02 DIAGNOSIS — R1310 Dysphagia, unspecified: Secondary | ICD-10-CM | POA: Diagnosis not present

## 2014-12-02 MED ORDER — PYRIDOSTIGMINE BROMIDE 60 MG PO TABS
30.0000 mg | ORAL_TABLET | Freq: Three times a day (TID) | ORAL | Status: DC
Start: 2014-12-02 — End: 2015-01-04

## 2014-12-02 NOTE — Progress Notes (Signed)
[facility]  PATIENT: Adam [name] DOB: 26-Jan-1929  REFERRING CLINICIAN: D Delo HISTORY FROM: patient and wife  REASON FOR VISIT: new consult    HISTORICAL  CHIEF COMPLAINT:  Chief Complaint  Patient presents with  . New Evaluation    weakness, slurred speech, numbness     HISTORY OF PRESENT ILLNESS:   81 year old right-handed male here for evaluation of slurred speech and weakness. For past 5-6 month patient has had progression of progressive slurred speech. Also has noted weakness in his right fingers. He is having more generalized fatigue and swallowing difficulty. 4 months ago he was able to walk 1-1/4 miles for exercise. Now he gets significantly short of breath after walking for half a mile. Patient went to the emergency room recently for evaluation. Now referred to me for consultation.  Patient denies any drooping eyelids, numbness or tingling. Patient started a statin medication one month ago. The symptoms started previously to onset of statin medication. No ongoing cramps or pain.    REVIEW OF SYSTEMS: Full 14 system review of systems performed and notable only for trouble swallowing itching weight loss fatigue blurred vision double vision shortness of breath weakness slurred speech difficult swallowing decreased energy.  ALLERGIES: Allergies  Allergen Reactions  . Adhesive [Tape]     Skin Irritation    HOME MEDICATIONS: Outpatient Prescriptions Prior to Visit  Medication Sig Dispense Refill  . aspirin 81 MG tablet Take 81 mg by mouth daily.    Marland Kitchen atorvastatin (LIPITOR) 10 MG tablet Take 10 mg by mouth daily at 6 PM.   0  . CALCIUM-VITAMIN D PO Take 1 tablet by mouth daily.     . Cyanocobalamin (VITAMIN B 12 PO) Take by mouth every morning.    . furosemide (LASIX) 20 MG tablet Take 20 mg by mouth. Only takes as needed    . losartan (COZAAR) 50 MG tablet Take 50 mg by mouth daily.    . magnesium 30 MG tablet Take 30 mg by mouth daily.      . minocycline (DYNACIN) 50 MG tablet Take 50 mg by mouth as needed.     . Multiple Vitamin (MULTIVITAMIN WITH MINERALS) TABS tablet Take 1 tablet by mouth daily.    . Multiple Vitamins-Minerals (EYE VITAMINS) CAPS Take by mouth 2 (two) times daily.    Marland Kitchen omeprazole (PRILOSEC) 40 MG capsule Take 40 mg by mouth daily.   1  . traMADol (ULTRAM) 50 MG tablet Take 1-2 tablets (50-100 mg total) by mouth every 6 (six) hours as needed for moderate pain. 30 tablet 0   No facility-administered medications prior to visit.    PAST MEDICAL HISTORY: Past Medical History  Diagnosis Date  . Chicken pox   . BC (bronchogenic carcinoma)   . Hypertension     takes Losartan daily  . Shortness of breath     with exertion  . History of bronchitis     early Feb 2015  . History of colon polyps     PAST SURGICAL HISTORY: Past Surgical History  Procedure Laterality Date  . Umbilical hernia repair    . Wisdom tooth extraction    . Circumcision      at age 18  . Tonsillectomy      as a child  . Colonoscopy    . Cataract surgery Bilateral   . Video assisted thoracoscopy Left 11/24/2013    Procedure: VIDEO ASSISTED THORACOSCOPY;  Surgeon: Melrose Nakayama, MD;  Location: [facility];  Service: Thoracic;  Laterality: Left;  . Lobectomy Left 11/24/2013    Procedure: LOBECTOMY;  Surgeon: Melrose Nakayama, MD;  Location: [facility];  Service: Thoracic;  Laterality: Left;  POSSIBLE LUL LOBECTOMY    FAMILY HISTORY: Family History  Problem Relation Age of Onset  . Hypertension Father     Deceased  . Stroke Father 62  . Ulcers Father   . Alcoholism Father   . Hyperlipidemia Father   . Glaucoma Mother 44    Deceased  . Dementia Sister   . Breast cancer Sister   . Healthy Brother   . Breast cancer Daughter   . Thyroid cancer Daughter     SOCIAL HISTORY:  History   Social History  . Marital Status: Married    Spouse Name: madge  . Number of Children: 2  . Years of Education: college     Occupational History  . Not on file.   Social History Main Topics  . Smoking status: Former Smoker -- 2.00 packs/day for 12 years    Types: Cigarettes    Start date: 12/15/1955    Quit date: 03/05/1974  . Smokeless tobacco: Never Used     Comment: quit 45 years ago  . Alcohol Use: 0.0 oz/week    0 Standard drinks or equivalent per week     Comment: daily wine  . Drug Use: No  . Sexual Activity: Not on file   Other Topics Concern  . Not on file   Social History Narrative   Lives with wife [name]   Drinks a cup and a half of coffee in the am      PHYSICAL EXAM  Filed Vitals:   12/02/14 1049  BP: 143/88  Pulse: 58  Height: 5\' 6"  (1.676 m)  Weight: 156 lb 6.4 oz (70.943 kg)    Body mass index is 25.26 kg/(m^2).   Visual Acuity Screening   Right eye Left eye Both eyes  Without correction:     With correction: 20/200 20/70     No flowsheet data found.  GENERAL EXAM: Patient is in no distress; well developed, nourished and groomed; neck is supple  CARDIOVASCULAR: Regular rate and rhythm, no murmurs, no carotid bruits  NEUROLOGIC: MENTAL STATUS: awake, alert, oriented to person, place and time, recent and remote memory intact, normal attention and concentration, language fluent, comprehension intact, naming intact, fund of knowledge appropriate CRANIAL NERVE: no papilledema on fundoscopic exam, pupils equal and reactive to light, visual fields full to confrontation, extraocular muscles intact, no nystagmus, facial sensation and strength symmetric, hearing intact, palate elevates symmetrically, uvula midline, shoulder shrug symmetric, tongue midline. NASAL / SLURRED SPEECH; NO DOUBLE VISION AFTER 1 MINUTE UPGAZE.  MOTOR: normal bulk and tone, full strength in the BUE, BLE; AFTER SIT/STAND X 10, PATIENT HAS SIGNIFICANT SOB AND WEAKNESS IN BILATERAL HIP FLEXORS (2-3/5) ABLE TO COUNT TO 40 IN 1 BREATH.  SENSORY: normal and symmetric to light touch, pinprick, temperature,  vibration COORDINATION: finger-nose-finger, fine finger movements, heel-shin normal REFLEXES: deep tendon reflexes present and symmetric GAIT/STATION: narrow based gait; able to walk on toes, heels and tandem; romberg is negative    DIAGNOSTIC DATA (LABS, IMAGING, TESTING) - I reviewed patient records, labs, notes, testing and imaging myself where available.  Lab Results  Component Value Date   WBC 8.0 11/30/2014   HGB 17.1 11/30/2014   HCT 48.6 11/30/2014   MCV 92 11/30/2014   PLT 208 11/30/2014      Component Value Date/Time  NA 144 11/30/2014 1038   NA 137 11/19/2014 1300   K 4.7 11/30/2014 1038   K 4.4 11/19/2014 1300   CL 100 11/30/2014 1038   CL 102 11/19/2014 1300   CO2 26 11/30/2014 1038   CO2 29 11/19/2014 1300   GLUCOSE 88 11/30/2014 1038   GLUCOSE 104* 11/19/2014 1300   BUN 14 11/30/2014 1038   BUN 11 11/19/2014 1300   CREATININE 0.8 11/30/2014 1038   CREATININE 0.94 11/19/2014 1300   CALCIUM 9.4 11/30/2014 1038   CALCIUM 8.9 11/19/2014 1300   PROT 6.7 11/30/2014 1038   PROT 6.6 11/19/2014 1300   ALBUMIN 4.0 11/19/2014 1300   AST 34 11/30/2014 1038   AST 30 11/19/2014 1300   ALT 36 11/30/2014 1038   ALT 30 11/19/2014 1300   ALKPHOS 74 11/30/2014 1038   ALKPHOS 77 11/19/2014 1300   BILITOT 0.90 11/30/2014 1038   BILITOT 0.9 11/19/2014 1300   GFRNONAA 75* 11/19/2014 1300   GFRAA 87* 11/19/2014 1300   No results found for: CHOL, HDL, LDLCALC, LDLDIRECT, TRIG, CHOLHDL No results found for: HGBA1C No results found for: VITAMINB12 Lab Results  Component Value Date   TSH 0.81 10/27/2014    11/30/14 CT CHEST - 1. No evidence lung cancers recurrence. Stable postoperative change in the left hemi thorax. 2. Stable small right pulmonary nodules.  11/28/14 MRI BRAIN - No acute or reversible finding. Mild age related atrophy and chronic small vessel change. [I reviewed images myself and agree with interpretation. -VRP]      ASSESSMENT AND PLAN  81 y.o.  year old male here with progressive slurred speech, weakness in right hand, fatigue, swallowing difficulty, double vision, exercise intolerance. History and exam are concerning for neuromuscular junction disorder such as myasthenia gravis. I will check additional testing for evaluation.  PLAN: - additional lab testing - empiric trial of mestinon  Orders Placed This Encounter  Procedures  . Acetylcholine Receptor, Binding  . CK  . VGCC Antibody   Meds ordered this encounter  Medications  . pyridostigmine (MESTINON) 60 MG tablet    Sig: Take 0.5-1 tablets (30-60 mg total) by mouth 3 (three) times daily.    Dispense:  90 tablet    Refill:  3   Return in about 1 month (around 01/01/2015).    Penni Bombard, MD 04/01/1323, 40:10 AM Certified in Neurology, Neurophysiology and Neuroimaging  [facility] [address] [place], [place] [address] [phone]

## 2014-12-02 NOTE — Patient Instructions (Signed)
I will test you for myasthenia gravis.  Try mestinon 30mg  (half tab) once a day, up to 3 times per day (with meals)

## 2014-12-03 LAB — VGCC ANTIBODY: VGCC ANTIBODY: NEGATIVE

## 2014-12-03 LAB — ACETYLCHOLINE RECEPTOR, BINDING: AChR Binding Ab, Serum: <0.03 nmol/L (ref 0.00–0.24)

## 2014-12-03 LAB — CK: CK TOTAL: 113 U/L (ref 24–204)

## 2014-12-08 ENCOUNTER — Encounter: Payer: Self-pay | Admitting: Thoracic Surgery (Cardiothoracic Vascular Surgery)

## 2014-12-08 ENCOUNTER — Ambulatory Visit (INDEPENDENT_AMBULATORY_CARE_PROVIDER_SITE_OTHER): Payer: Medicare Other | Admitting: Thoracic Surgery (Cardiothoracic Vascular Surgery)

## 2014-12-08 VITALS — BP 125/81 | HR 60 | Resp 16 | Ht 66.0 in | Wt 168.0 lb

## 2014-12-08 DIAGNOSIS — Z902 Acquired absence of lung [part of]: Secondary | ICD-10-CM

## 2014-12-08 DIAGNOSIS — C3412 Malignant neoplasm of upper lobe, left bronchus or lung: Secondary | ICD-10-CM

## 2014-12-08 DIAGNOSIS — Z9889 Other specified postprocedural states: Secondary | ICD-10-CM | POA: Diagnosis not present

## 2014-12-08 NOTE — Progress Notes (Signed)
[address]       [place] [address]             [phone]       HPI:  Adam [name] returns today for a one year follow up visit  He is an 81 year old gentleman who had a thoracoscopic left upper lobectomy in March 2015 for a T1, N1, stage IIA non-small cell carcinoma. He saw Dr. Marin Olp postoperatively. Due to his advanced age it was elected not to give added to the chemotherapy. I last saw him in September 2015. At that time he was doing well although he was still complaining of some shortness of breath.  More recently he continued to have complaints of shortness of breath. He saw Dr. Melvyn Novas in early March. His PFTs and walk tests were good. His FEV1 was 1.87, compared to 2.9 preoperatively. Dr. Melvyn Novas did not feel like his shortness of breath was due to pulmonary issues.  Last week he saw Dr. Leta Baptist from neurology for slurred speech and weakness in the right hand over 5-6 months. An MR showed no evidence of stroke. He suspected this might be something along the lines of myasthenia. He ordered acetylcholine receptor antibody levels.  He says his weight has been stable. Exercise tolerance is unchanged. He does feel short of breath with exertion. He denies any chest pain, pressure, or tightness. Neurologic complaints as noted above.  Past Medical History  Diagnosis Date  . Chicken pox   . BC (bronchogenic carcinoma)   . Hypertension     takes Losartan daily  . Shortness of breath     with exertion  . History of bronchitis     early Feb 2015  . History of colon polyps       Current Outpatient Prescriptions  Medication Sig Dispense Refill  . aspirin 81 MG tablet Take 81 mg by mouth daily.    Marland Kitchen atorvastatin (LIPITOR) 10 MG tablet Take 10 mg by mouth daily at 6 PM.   0  . CALCIUM-VITAMIN D PO Take 1 tablet by mouth daily.     . Cyanocobalamin (VITAMIN B 12 PO) Take by mouth every morning.    . furosemide (LASIX) 20 MG tablet Take 20 mg by mouth. Only  takes as needed    . losartan (COZAAR) 50 MG tablet Take 50 mg by mouth daily.    . magnesium 30 MG tablet Take 30 mg by mouth daily.    . minocycline (DYNACIN) 50 MG tablet Take 50 mg by mouth as needed.     . Multiple Vitamin (MULTIVITAMIN WITH MINERALS) TABS tablet Take 1 tablet by mouth daily.    . Multiple Vitamins-Minerals (EYE VITAMINS) CAPS Take by mouth 2 (two) times daily.    Marland Kitchen pyridostigmine (MESTINON) 60 MG tablet Take 0.5-1 tablets (30-60 mg total) by mouth 3 (three) times daily. 90 tablet 3   No current facility-administered medications for this visit.    Physical Exam BP 125/81 mmHg  Pulse 60  Resp 16  Ht 5\' 6"  (1.676 m)  Wt 168 lb (76.204 kg)  BMI 27.13 kg/m2  SpO67 37% 81 year old man in no acute distress Alert and oriented 3 with mild slurring of speech No cervical or supraclavicular adenopathy Lungs diminished breath sounds left base, otherwise clear Incisions well healed  Diagnostic Tests: I reviewed his CT chest and compared to study from 06/01/2014. It shows no evidence recurrent disease.  Impression: 81 year old man who is now one year out  from a thoracoscopic left upper lobectomy for a stage IIa (T1, N1) non-small cell carcinoma. He has no evidence of recurrent disease at this time.  He currently is being evaluated for slurred speech and right-handed weakness by Dr. Leta Baptist.  Plan: Return in 6 months with CT of chest  Melrose Nakayama, MD [facility] [phone]   I spent 15 minutes face-to-face with Mr. and [name] during this visit

## 2014-12-10 ENCOUNTER — Other Ambulatory Visit: Payer: Self-pay | Admitting: *Deleted

## 2014-12-10 ENCOUNTER — Encounter: Payer: Self-pay | Admitting: *Deleted

## 2014-12-10 ENCOUNTER — Telehealth: Payer: Self-pay | Admitting: Diagnostic Neuroimaging

## 2014-12-10 DIAGNOSIS — R531 Weakness: Secondary | ICD-10-CM

## 2014-12-10 DIAGNOSIS — G7000 Myasthenia gravis without (acute) exacerbation: Secondary | ICD-10-CM

## 2014-12-10 NOTE — Telephone Encounter (Signed)
Spoke to the pts wife on the phone and relayed that his recent lab work was normal. I also spoke with Dr. Leta Baptist who asked me to tell the pt to increase his mestonin to 1 tablet TID. Dr. Mamie Nick also asked me to place an order for NVC/EMG for myasthenia gravis and inform the pt that he would have this test done here in the office. I also let the wife know that the pt should here something within the next week to get his appt scheduled and that Dr. Leta Baptist would run additional lab work that day. Wife also stated that they had talked to the NP at the community they lived in who recommended speech therapy for him. She wanted Dr. Leta Baptist to know that they were going to do it with the onsite ST staff. She thanked me and stated an understanding

## 2014-12-10 NOTE — Telephone Encounter (Signed)
Pt's spouse is calling stating that she has been waiting on lab results, she said it has been over a week.  Also she thinks it's time to increase medication pyridostigmine (MESTINON) 60 MG tablet.  Please call and advise.

## 2014-12-17 ENCOUNTER — Ambulatory Visit (INDEPENDENT_AMBULATORY_CARE_PROVIDER_SITE_OTHER): Payer: Medicare Other | Admitting: Diagnostic Neuroimaging

## 2014-12-17 ENCOUNTER — Other Ambulatory Visit (INDEPENDENT_AMBULATORY_CARE_PROVIDER_SITE_OTHER): Payer: Self-pay

## 2014-12-17 ENCOUNTER — Encounter (INDEPENDENT_AMBULATORY_CARE_PROVIDER_SITE_OTHER): Payer: Self-pay | Admitting: Diagnostic Neuroimaging

## 2014-12-17 DIAGNOSIS — R531 Weakness: Secondary | ICD-10-CM

## 2014-12-17 DIAGNOSIS — Z0289 Encounter for other administrative examinations: Secondary | ICD-10-CM

## 2014-12-17 NOTE — Procedures (Signed)
   [facility]  NCS (NERVE CONDUCTION STUDY) WITH EMG (ELECTROMYOGRAPHY) REPORT   STUDY DATE: 12/17/14 PATIENT NAME: Adam [name] DOB: 09-May-1929 MRN: [id number]  ORDERING CLINICIAN: Andrey Spearman, MD   TECHNOLOGIST: Towana Badger  ELECTROMYOGRAPHER: Earlean Polka. Penumalli, MD  CLINICAL INFORMATION: 81 year old male with progressive dysphagia, dysarthria, weakness. Exam notable for diffuse fasciculations in the tongue, arms and legs. Weakness in right hand/grip and bilateral hip flexors. Reflexes in upper extremities 1+, knees 2+, ankles 1+. Normal sensation.   FINDINGS: NERVE CONDUCTION STUDY: Right median motor response could not be obtained. Right ulnar motor response has normal distal latency, decreased amplitude, normal conduction velocity and normal F-wave latency. Right peroneal motor response and F-wave latency are normal. Left peroneal motor response has decreased amplitude with temporal dispersion, normal conduction velocity and prolonged F-wave latency. Right tibial motor response has normal amplitude, conduction velocity and prolonged F-wave latency. Left tibial motor response has normal amplitude, conduction velocity, but F-wave latency could not be obtained.  Right median sensory response has normal amplitude and slow conduction velocity (36 m/s). Right ulnar and right radial sensory responses are normal. Left sural and bilateral peroneal sensory responses could not be obtained. Right sural sensory response has decreased amplitude and normal conduction velocity.  Repetitive nerve stimulation of right ulnar nerve at baseline, immediately after exertion, 30 seconds, 60 seconds, 90 seconds, 122nd, 180 seconds post exertion, shows no significant incremental or decremental response.\   NEEDLE ELECTROMYOGRAPHY: Needle examination demonstrates: Right deltoid, biceps, triceps, flexor carpi radialis - 2+ fasciculations at rest and decreased recruitment of large motor  units on exertion Right first dorsal interosseous - 2+ fasciculations, positive sharp waves and fibrillation potentials at rest and significantly decreased recruitment of large motor units on exertion Right vastus medialis - fasciculations, fibrillation potentials and positive sharp waves at rest and normal motor unit recruitment on exertion Right tibialis anterior, gastrocnemius - fasciculations, fibrillation potentials, positive sharp waves at rest and decreased motor unit on exertion Right genioglossus via submental approach - fasciculations at rest and decreased motor unit recruitment on exertion Right T8-9 paraspinal - fasciculations, fibrillation potentials and positive sharp waves   IMPRESSION: Abnormal study demonstrating: 1. 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 2. Markedly abnormal needle EMG with widespread fasciculations, fibrillation potentials, positive sharp waves and chronic denervation changes in cervical, thoracic, lumbar, bulbar regions. 3. Given the clinical context, considerations include immune neuropathies (CIDP, anti-sulfatide, anti-MAG, anti-GM1, monoclonal gammopathy), hereditary neuropathies (CMT, HMSN), infectious neuropathies (HIV) or metabolic causes (diabetes, hypothyroidism, liver disease). Another consideration would include disorders of motor nerves and/or their axons in combination with underlying sensory neuropathy.       INTERPRETING PHYSICIAN:  Penni Bombard, MD Certified in Neurology, Neurophysiology and Neuroimaging  [facility] [address], [place], [place] [address] [phone]

## 2014-12-18 LAB — ANTI-SMOOTH MUSCLE ANTIBODY, IGG: Smooth Muscle Ab: 8 Units (ref 0–19)

## 2014-12-23 ENCOUNTER — Telehealth: Payer: Self-pay | Admitting: Diagnostic Neuroimaging

## 2014-12-23 NOTE — Telephone Encounter (Signed)
Danielle with [facility] is calling regarding a Lumbar Puncture that is scheduled for tomorrow for the patient. The order states a Neuropathy Panel but Andee Poles states they are not familiar with that. Please call and advise.

## 2014-12-23 NOTE — Telephone Encounter (Signed)
Called and left a message for Adam [name] after talking with Dr. Leta Baptist. [name] the [employer] [facility] gave me the tests that are run for a neuropathy panel. I left a message asking them to draw labwork while the pt was there at [facility] and to test for 2SST 1LAV, Vit D, Vit B12, SPEP, TSH, ESR RA factor, and angio converting enzyme. Asked her to call back with any further issues

## 2014-12-24 ENCOUNTER — Encounter: Payer: Self-pay | Admitting: Radiology

## 2014-12-24 ENCOUNTER — Other Ambulatory Visit: Payer: Self-pay | Admitting: Radiology

## 2014-12-24 ENCOUNTER — Ambulatory Visit
Admission: RE | Admit: 2014-12-24 | Discharge: 2014-12-24 | Disposition: A | Discharge status: Home / Self Care | Payer: Medicare Other | Source: Ambulatory Visit | Attending: Diagnostic Neuroimaging | Admitting: Diagnostic Neuroimaging

## 2014-12-24 ENCOUNTER — Other Ambulatory Visit: Payer: Self-pay | Admitting: Diagnostic Neuroimaging

## 2014-12-24 DIAGNOSIS — R531 Weakness: Secondary | ICD-10-CM

## 2014-12-24 LAB — CSF CELL COUNT WITH DIFFERENTIAL
RBC COUNT CSF: 3 uL — AB
Tube #: 3
WBC, CSF: 2 cu mm (ref 0–5)

## 2014-12-24 LAB — GLUCOSE, CSF: Glucose, CSF: 68 mg/dL (ref 43–76)

## 2014-12-24 LAB — PROTEIN, CSF: TOTAL PROTEIN, CSF: 77 mg/dL — AB (ref 15–45)

## 2014-12-24 NOTE — Telephone Encounter (Signed)
Spoke with Adam [name] at [facility] and she told me that they do not do lab work at their facility and could not do those labs from his lumbar panel. She stated she would preform all lab work from what Dr. Leta Baptist had ordered as far as the lumbar puncture went but that the pt would need to go to another facility to get lab work done

## 2014-12-24 NOTE — Discharge Instructions (Signed)

## 2015-01-04 ENCOUNTER — Ambulatory Visit (INDEPENDENT_AMBULATORY_CARE_PROVIDER_SITE_OTHER): Payer: Medicare Other | Admitting: Diagnostic Neuroimaging

## 2015-01-04 ENCOUNTER — Encounter: Payer: Self-pay | Admitting: Diagnostic Neuroimaging

## 2015-01-04 VITALS — BP 141/83 | HR 64 | Ht 66.0 in | Wt 156.4 lb

## 2015-01-04 DIAGNOSIS — R1310 Dysphagia, unspecified: Secondary | ICD-10-CM | POA: Diagnosis not present

## 2015-01-04 DIAGNOSIS — G6181 Chronic inflammatory demyelinating polyneuritis: Secondary | ICD-10-CM | POA: Diagnosis not present

## 2015-01-04 DIAGNOSIS — R531 Weakness: Secondary | ICD-10-CM | POA: Diagnosis not present

## 2015-01-04 DIAGNOSIS — R471 Dysarthria and anarthria: Secondary | ICD-10-CM | POA: Diagnosis not present

## 2015-01-04 NOTE — Patient Instructions (Signed)
Chronic Inflammatory Demyelinating Polyneuropathy Chronic inflammatory demyelinating polyneuropathy (CIDP) is a condition in which damage to nerves results in impaired nerve function. This may cause diminished or unusual sensations, abnormal muscle function, or both.  CAUSES  CIDP is caused when the covering of nerves (myelin sheath) is damaged. In the most common form of the disorder, the damage is believed to occur because the immune system (cells that protect the body against foreign invaders, like bacteria and viruses) accidentally attacks the myelin.   TREATMENT: - IVIG treatments, every 3 weeks - check CBC and CMP every 1-2 months

## 2015-01-04 NOTE — Progress Notes (Signed)
[facility]  PATIENT: Adam [name] DOB: [birth year]  PCP: Linus Mako, NP at [facility] HISTORY FROM: patient and wife  REASON FOR VISIT: follow up    HISTORICAL  CHIEF COMPLAINT:  Chief Complaint  Patient presents with  . Follow-up    weakness    HISTORY OF PRESENT ILLNESS:   UPDATE 01/04/15: Since last visit, had EMG, labs, LP. 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   PRIOR HPI (12/02/14): 81 year old right-handed male here for evaluation of slurred speech and weakness. For past 5-6 month patient has had progression of progressive slurred speech. Also has noted weakness in his right fingers. He is having more generalized fatigue and swallowing difficulty. 4 months ago he was able to walk 1-1/4 miles for exercise. Now he gets significantly short of breath after walking for half a mile. Patient went to the emergency room recently for evaluation. Now referred to me for consultation. Patient denies any drooping eyelids, numbness or tingling. Patient started a statin medication one month ago. The symptoms started previously to onset of statin medication. No ongoing cramps or pain.   REVIEW OF SYSTEMS: Full 14 system review of systems performed and notable only for trouble swallowing itching weight loss fatigue blurred vision double vision shortness of breath weakness slurred speech difficult swallowing decreased energy.  ALLERGIES: Allergies  Allergen Reactions  . Adhesive [Tape] Other (See Comments)    Skin Irritation    HOME MEDICATIONS: Outpatient Prescriptions Prior to Visit  Medication Sig Dispense Refill  . aspirin 81 MG tablet Take 81 mg by mouth daily.    Marland Kitchen atorvastatin (LIPITOR) 10 MG tablet Take 10 mg by mouth daily at 6 PM.   0  . CALCIUM-VITAMIN D PO Take 1 tablet by mouth daily.     . Cyanocobalamin (VITAMIN B 12 PO) Take by mouth every morning.    . furosemide (LASIX) 20 MG tablet Take 20 mg by mouth. Only takes as needed    .  losartan (COZAAR) 50 MG tablet Take 50 mg by mouth daily.    . magnesium 30 MG tablet Take 30 mg by mouth daily.    . minocycline (DYNACIN) 50 MG tablet Take 50 mg by mouth as needed.     . Multiple Vitamin (MULTIVITAMIN WITH MINERALS) TABS tablet Take 1 tablet by mouth daily.    . Multiple Vitamins-Minerals (EYE VITAMINS) CAPS Take by mouth 2 (two) times daily.    Marland Kitchen pyridostigmine (MESTINON) 60 MG tablet Take 0.5-1 tablets (30-60 mg total) by mouth 3 (three) times daily. (Patient taking differently: Take 60 mg by mouth 3 (three) times daily. ) 90 tablet 3   No facility-administered medications prior to visit.    PAST MEDICAL HISTORY: Past Medical History  Diagnosis Date  . Chicken pox   . BC (bronchogenic carcinoma)   . Hypertension     takes Losartan daily  . Shortness of breath     with exertion  . History of bronchitis     early Feb 2015  . History of colon polyps     PAST SURGICAL HISTORY: Past Surgical History  Procedure Laterality Date  . Umbilical hernia repair    . Wisdom tooth extraction    . Circumcision      at age 8  . Tonsillectomy      as a child  . Colonoscopy    . Cataract surgery Bilateral   . Video assisted thoracoscopy Left 11/24/2013    Procedure: VIDEO ASSISTED THORACOSCOPY;  Surgeon: Melrose Nakayama, MD;  Location: [facility];  Service: Thoracic;  Laterality: Left;  . Lobectomy Left 11/24/2013    Procedure: LOBECTOMY;  Surgeon: Melrose Nakayama, MD;  Location: [facility];  Service: Thoracic;  Laterality: Left;  POSSIBLE LUL LOBECTOMY    FAMILY HISTORY: Family History  Problem Relation Age of Onset  . Hypertension Father     Deceased  . Stroke Father 46  . Ulcers Father   . Alcoholism Father   . Hyperlipidemia Father   . Glaucoma Mother 7    Deceased  . Dementia Sister   . Breast cancer Sister   . Healthy Brother   . Breast cancer Daughter   . Thyroid cancer Daughter     SOCIAL HISTORY:  History   Social History  . Marital Status:  Married    Spouse Name: madge  . Number of Children: 2  . Years of Education: college    Occupational History  . Not on file.   Social History Main Topics  . Smoking status: Former Smoker -- 2.00 packs/day for 12 years    Types: Cigarettes    Start date: 12/15/1955    Quit date: 03/05/1974  . Smokeless tobacco: Never Used     Comment: quit 45 years ago  . Alcohol Use: 0.0 oz/week    0 Standard drinks or equivalent per week     Comment: wine during the week, not every day   . Drug Use: No  . Sexual Activity: Not on file   Other Topics Concern  . Not on file   Social History Narrative   Lives with wife [name]   Drinks a cup and a half of coffee in the am      PHYSICAL EXAM  Filed Vitals:   01/04/15 1039  BP: 141/83  Pulse: 64  Height: '5\' 6"'$  (1.676 m)  Weight: 156 lb 6.4 oz (70.943 kg)   Wt Readings from Last 3 Encounters:  01/04/15 156 lb 6.4 oz (70.943 kg)  12/08/14 168 lb (76.204 kg)  12/02/14 156 lb 6.4 oz (70.943 kg)   Body mass index is 25.26 kg/(m^2).   GENERAL EXAM: Patient is in no distress; well developed, nourished and groomed; neck is supple  CARDIOVASCULAR: Regular rate and rhythm, no murmurs, no carotid bruits  NEUROLOGIC: MENTAL STATUS: awake, alert, language fluent, comprehension intact, naming intact, fund of knowledge appropriate CRANIAL NERVE: pupils equal and reactive to light, visual fields full to confrontation, extraocular muscles intact, no nystagmus, facial sensation and strength symmetric, hearing intact, palate elevates symmetrically, uvula midline, shoulder shrug symmetric, tongue midline. NASAL / SLURRED SPEECH; NO DOUBLE VISION AFTER 1 MINUTE UPGAZE.  MOTOR: DECR BULK, DIFFUSE FASCICULATIONS IN SHOULDERS, TRICEPS, FOREARMS, THIGHS AND CALVES; full strength in the BUE, BLE; EXCEPT RIGHT HAND FINGER ABDUCTION AND EXT; DECR FOOT DF SENSORY: VIB < 5 SEC AT TOES; DECR LT AND PP IN FEET COORDINATION: finger-nose-finger, fine finger  movements normal REFLEXES: BUE TRACE; RIGHT KNEE 2, LEFT KNEE 1, ANKLES 0 GAIT/STATION: narrow based gait; DIFF WTH HEEL GAIT (ESP RIGHT FOOT)    DIAGNOSTIC DATA (LABS, IMAGING, TESTING) - I reviewed patient records, labs, notes, testing and imaging myself where available.  Lab Results  Component Value Date   WBC 8.0 11/30/2014   HGB 17.1 11/30/2014   HCT 48.6 11/30/2014   MCV 92 11/30/2014   PLT 208 11/30/2014      Component Value Date/Time   NA 144 11/30/2014 1038   NA 137 11/19/2014 1300  K 4.7 11/30/2014 1038   K 4.4 11/19/2014 1300   CL 100 11/30/2014 1038   CL 102 11/19/2014 1300   CO2 26 11/30/2014 1038   CO2 29 11/19/2014 1300   GLUCOSE 88 11/30/2014 1038   GLUCOSE 104* 11/19/2014 1300   BUN 14 11/30/2014 1038   BUN 11 11/19/2014 1300   CREATININE 0.8 11/30/2014 1038   CREATININE 0.94 11/19/2014 1300   CALCIUM 9.4 11/30/2014 1038   CALCIUM 8.9 11/19/2014 1300   PROT 6.7 11/30/2014 1038   PROT 6.6 11/19/2014 1300   ALBUMIN 4.0 11/19/2014 1300   AST 34 11/30/2014 1038   AST 30 11/19/2014 1300   ALT 36 11/30/2014 1038   ALT 30 11/19/2014 1300   ALKPHOS 74 11/30/2014 1038   ALKPHOS 77 11/19/2014 1300   BILITOT 0.90 11/30/2014 1038   BILITOT 0.9 11/19/2014 1300   GFRNONAA 75* 11/19/2014 1300   GFRAA 87* 11/19/2014 1300   No results found for: CHOL, HDL, LDLCALC, LDLDIRECT, TRIG, CHOLHDL No results found for: HGBA1C No results found for: VITAMINB12 Lab Results  Component Value Date   TSH 0.81 10/27/2014    11/30/14 CT CHEST - 1. No evidence lung cancers recurrence. Stable postoperative change in the left hemi thorax. 2. Stable small right pulmonary nodules.  11/28/14 MRI BRAIN - No acute or reversible finding. Mild age related atrophy and chronic small vessel change. [I reviewed images myself and agree with interpretation. -VRP]   12/17/14 EMG/NCS Abnormal study demonstrating: 1. 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 2. Markedly abnormal needle EMG with widespread fasciculations, fibrillation potentials, positive sharp waves and chronic denervation changes in cervical, thoracic, lumbar, bulbar regions. 3. Given the clinical context, considerations include immune neuropathies (CIDP, anti-sulfatide, anti-MAG, anti-GM1, monoclonal gammopathy), hereditary neuropathies (CMT, HMSN), infectious neuropathies (HIV) or metabolic causes (diabetes, hypothyroidism, liver disease). Another consideration would include disorders of motor nerves and/or their axons in combination with underlying sensory neuropathy.  12/02/14 AchR ab < 0.03, anti-VGCC neg, CK 113  12/24/14 CSF - WBC 2, RBC 3, protein 77, glucose 68, gram stain negative    ASSESSMENT AND PLAN  81 y.o. year old male here with progressive slurred speech, weakness in right hand, fatigue, swallowing difficulty, double vision, exercise intolerance. History and exam are concerning for neuromuscular junction disorder such as CIDP.   PLAN: - IVIG therapy - neuropathy panel labs  Return in about 3 months (around 04/06/2015).    Penni Bombard, MD 09/03/5100, 58:52 AM Certified in Neurology, Neurophysiology and Neuroimaging  [facility] [address] [place], [place] [address] [phone]

## 2015-01-05 LAB — NEUROPATHY PANEL
A/G Ratio: 1.5 (ref 0.7–2.0)
Albumin ELP: 3.7 g/dL (ref 3.2–5.6)
Alpha 1: 0.2 g/dL (ref 0.1–0.4)
Alpha 2: 0.7 g/dL (ref 0.4–1.2)
Angio Convert Enzyme: 40 U/L (ref 14–82)
Anti Nuclear Antibody(ANA): NEGATIVE
BETA: 0.9 g/dL (ref 0.6–1.3)
GAMMA GLOBULIN: 0.7 g/dL (ref 0.5–1.6)
Globulin, Total: 2.5 g/dL (ref 2.0–4.5)
RHEUMATOID FACTOR: 20 [IU]/mL — AB (ref 0.0–13.9)
Sed Rate: 2 mm/hr (ref 0–30)
TSH: 1.14 u[IU]/mL (ref 0.450–4.500)
Total Protein: 6.2 g/dL (ref 6.0–8.5)
Vit D, 25-Hydroxy: 31.9 ng/mL (ref 30.0–100.0)
Vitamin B-12: 1251 pg/mL — ABNORMAL HIGH (ref 211–946)

## 2015-01-06 ENCOUNTER — Telehealth: Payer: Self-pay | Admitting: *Deleted

## 2015-01-06 NOTE — Telephone Encounter (Signed)
Seth Bake sent me a skype saying Sandi Mariscal was on the line and asked me to call him back at [phone] regarding this pt  Called Adam with no response, left message asking him to call back  Quita Skye called again and asked me to call him back  Maryruth Eve who stated that the insurance coverage would probably not be enough to help with the cost of the in home IVIG. We talked about out patient IVIG. i told him to go ahead and run the insurance cost for in home and we could see what the cost out of pocket would be, and if needed we could reorder the referral as outpatient. He also asked for the IVIG dosage. I told him I would fax him that information when I talked to Dr. Leta Baptist. He thanked me.   Talked with Dr. Mamie Nick and he told me to print of the referral paperwork and fax it and if necessary that we could reorder the referral to outpatient

## 2015-01-11 ENCOUNTER — Telehealth: Payer: Self-pay | Admitting: Diagnostic Neuroimaging

## 2015-01-11 NOTE — Telephone Encounter (Signed)
Parks Ranger from [facility] [phone]) called and requested to speak with Aldona Bar RN regarding a pre authorization form that is needed for insurance. She stated that she does not have access to a fax machine and requested to possibly email the information to Dr. Leta Baptist or Aldona Bar RN. Her email is [email or web address]. Please call and advise.

## 2015-01-11 NOTE — Telephone Encounter (Signed)
Spoke with Asencion Partridge and confirmed with her that I had filled out the paperwork for the prior auth for this pt and had faxed it to her this am. She also asked for my work email which I gave her and told me that she could email me quicker than a phone call. I told her that I would prefer communications through the switch board since I could document that they had occurred. She and i thanked one another

## 2015-01-12 NOTE — Telephone Encounter (Signed)
Parks Ranger from [facility] called wanting to confirm to see if Aldona Bar received the email she sent that included the form that needs to be filled out. The form needs TAX ID, NPI & provider's signature. Please fax the form to # [phone]. Asencion Partridge can be reached @ [phone]

## 2015-01-20 ENCOUNTER — Telehealth: Payer: Self-pay | Admitting: *Deleted

## 2015-01-20 NOTE — Telephone Encounter (Signed)
Previous comment: Spoke with Adam around 01/06/15 and was under the impression that the cost of in home IVIG had been relayed to the pt and his wife. Adam informed me that seeking IVIG through an infusion suite would be a better option for the pt financially. I spoke with Dr. Leta Baptist on 01/08/15 when i returned to the office and he asked me to see if Otila Kluver the infusion RN at [facility] would be able to be the pt approved for IVIG here.  I gave the info to Otila Kluver on Friday 01/08/15 and on Monday 01/11/15 she started working towards getting the pt approved. She told me today 01/20/15 that the pt was approved for IVIG here and asked me to call the wife.   I called the wife and she was upset that she had not heard anything. I told her that it can take 1-2 weeks to get things started and that I was under the impression that she had spoken to Jud who had told her that the prices for the pts IVIG in the home would be astronomical. She stated that she never received a call about that but "a company in [place] keeps calling me saying they are working on the approval process with [employer]" and she was not sure what was going on with this. I told her that I was not sure but I apologized that I had been misinformed, thinking that she had received information about the cost of in home IVIG. I was able to deescalate her mood by informing her that he was approved for IVIG here in the office and Otila Kluver RN had told me today that he was going to be starting. Otila Kluver told me to tell the pts wife that she would call them in the morning when she came into the office. [name] was very pleased to hear this and then joked around with me. She thanked me for everything and I told her to call me back in the future if she did not hear an approval or hear from me within a week. She thanked me   I told Dr. Leta Baptist about the mix up he was ok with things, he was ok   Left a note for Otila Kluver RN to call the wife in the am, she has a different charting  system so no notes from them will appear in the chart

## 2015-01-20 NOTE — Telephone Encounter (Addendum)
Pt's wife([name]) called stating they were confused on why it is taking so long to get the in home IVIG set up. She has spoken to them on several occasions and called again y on 01/19/15 but has not heard anything . She would like your help if possible. Please call and advise. She can be reached at [phone].

## 2015-01-20 NOTE — Telephone Encounter (Signed)
error 

## 2015-01-20 NOTE — Telephone Encounter (Addendum)
Pt's wife called again about the IVIG treatment. She states that they have not heard anything about the treatment and they are getting frustrated waiting to hear back from someone. Please call and advise. [phone])

## 2015-02-12 ENCOUNTER — Encounter: Payer: Self-pay | Admitting: Diagnostic Neuroimaging

## 2015-02-15 ENCOUNTER — Telehealth: Payer: Self-pay | Admitting: Diagnostic Neuroimaging

## 2015-02-15 NOTE — Telephone Encounter (Signed)
Patient 's friend is calling to discuss problems after infusion. His right side is weak, speech slurring, swallowing on right side not good, lack of appetite, more tired. No better after after infusion.  Please call.

## 2015-02-15 NOTE — Telephone Encounter (Signed)
Spoke with Dr. Leta Baptist about the issues that the pt is having, he asked me to get the pt in this week for an appt to talk about things  I called the pt and spoke with his wife and him and was able to get an appt with him for 02/16/15 at 0930. i told him to get here early. He told me that he would bring his wife

## 2015-02-16 ENCOUNTER — Encounter: Payer: Self-pay | Admitting: Diagnostic Neuroimaging

## 2015-02-16 ENCOUNTER — Ambulatory Visit (INDEPENDENT_AMBULATORY_CARE_PROVIDER_SITE_OTHER): Payer: Medicare Other | Admitting: Diagnostic Neuroimaging

## 2015-02-16 VITALS — BP 143/91 | HR 90 | Ht 66.0 in | Wt 159.8 lb

## 2015-02-16 DIAGNOSIS — R531 Weakness: Secondary | ICD-10-CM

## 2015-02-16 DIAGNOSIS — G6181 Chronic inflammatory demyelinating polyneuritis: Secondary | ICD-10-CM | POA: Diagnosis not present

## 2015-02-16 DIAGNOSIS — R471 Dysarthria and anarthria: Secondary | ICD-10-CM | POA: Diagnosis not present

## 2015-02-16 DIAGNOSIS — R292 Abnormal reflex: Secondary | ICD-10-CM

## 2015-02-16 DIAGNOSIS — R1310 Dysphagia, unspecified: Secondary | ICD-10-CM

## 2015-02-16 DIAGNOSIS — R0602 Shortness of breath: Secondary | ICD-10-CM | POA: Diagnosis not present

## 2015-02-16 NOTE — Patient Instructions (Signed)
I will setup pulmonary and neurology appts.  I will check MRI scans.  Continue IVIG therapy.

## 2015-02-16 NOTE — Progress Notes (Signed)
[facility]  PATIENT: Adam [name] DOB: October 15, 1928  PCP: Linus Mako, NP at [facility] HISTORY FROM: patient and wife  REASON FOR VISIT: follow up    HISTORICAL  CHIEF COMPLAINT:  Chief Complaint  Patient presents with  . Chronic Inflammatory Demyliniating Polyneuropathy    rm 7, wife, "Not getting better as quickly as he thought he would, fatigue"  . Follow-up    HISTORY OF PRESENT ILLNESS:   UPDATE 02/16/15: Since last visit, has completed 1 week of IVIG therapy (02/01/15-02/05/15), with no benefit. Having progressive fatigue, SOB, weight loss. Has lost 10-15 lbs in last 18 months.   UPDATE 01/04/15: Since last visit, had EMG, labs, LP. 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   PRIOR HPI (12/02/14): 81 year old right-handed male here for evaluation of slurred speech and weakness. For past 5-6 month patient has had progression of progressive slurred speech. Also has noted weakness in his right fingers. He is having more generalized fatigue and swallowing difficulty. 4 months ago he was able to walk 1-1/4 miles for exercise. Now he gets significantly short of breath after walking for half a mile. Patient went to the emergency room recently for evaluation. Now referred to me for consultation. Patient denies any drooping eyelids, numbness or tingling. Patient started a statin medication one month ago. The symptoms started previously to onset of statin medication. No ongoing cramps or pain.   REVIEW OF SYSTEMS: Full 14 system review of systems performed and notable only for trouble swallowing itching weight loss fatigue blurred vision double vision shortness of breath weakness slurred speech difficult swallowing decreased energy agitation.   ALLERGIES: Allergies  Allergen Reactions  . Adhesive [Tape] Other (See Comments)    Skin Irritation    HOME MEDICATIONS: Outpatient Prescriptions Prior to Visit  Medication Sig Dispense Refill  . aspirin 81 MG  tablet Take 81 mg by mouth daily.    Marland Kitchen atorvastatin (LIPITOR) 10 MG tablet Take 10 mg by mouth daily at 6 PM.   0  . CALCIUM-VITAMIN D PO Take 1 tablet by mouth daily.     . Cyanocobalamin (VITAMIN B 12 PO) Take by mouth every morning.    . furosemide (LASIX) 20 MG tablet Take 20 mg by mouth. Only takes as needed    . losartan (COZAAR) 50 MG tablet Take 50 mg by mouth daily.    . magnesium 30 MG tablet Take 30 mg by mouth daily.    . minocycline (DYNACIN) 50 MG tablet Take 50 mg by mouth as needed.     . Multiple Vitamin (MULTIVITAMIN WITH MINERALS) TABS tablet Take 1 tablet by mouth daily.    . Multiple Vitamins-Minerals (EYE VITAMINS) CAPS Take by mouth 2 (two) times daily.     No facility-administered medications prior to visit.    PAST MEDICAL HISTORY: Past Medical History  Diagnosis Date  . Chicken pox   . BC (bronchogenic carcinoma)   . Hypertension     takes Losartan daily  . Shortness of breath     with exertion  . History of bronchitis     early Feb 2015  . History of colon polyps     PAST SURGICAL HISTORY: Past Surgical History  Procedure Laterality Date  . Umbilical hernia repair    . Wisdom tooth extraction    . Circumcision      at age 78  . Tonsillectomy      as a child  . Colonoscopy    . Cataract  surgery Bilateral   . Video assisted thoracoscopy Left 11/24/2013    Procedure: VIDEO ASSISTED THORACOSCOPY;  Surgeon: Melrose Nakayama, MD;  Location: [facility];  Service: Thoracic;  Laterality: Left;  . Lobectomy Left 11/24/2013    Procedure: LOBECTOMY;  Surgeon: Melrose Nakayama, MD;  Location: [facility];  Service: Thoracic;  Laterality: Left;  POSSIBLE LUL LOBECTOMY    FAMILY HISTORY: Family History  Problem Relation Age of Onset  . Hypertension Father     Deceased  . Stroke Father 38  . Ulcers Father   . Alcoholism Father   . Hyperlipidemia Father   . Glaucoma Mother 72    Deceased  . Dementia Sister   . Breast cancer Sister   . Healthy Brother     . Breast cancer Daughter   . Thyroid cancer Daughter     SOCIAL HISTORY:  History   Social History  . Marital Status: Married    Spouse Name: madge  . Number of Children: 2  . Years of Education: college    Occupational History  . Not on file.   Social History Main Topics  . Smoking status: Former Smoker -- 2.00 packs/day for 12 years    Types: Cigarettes    Start date: 12/15/1955    Quit date: 03/05/1974  . Smokeless tobacco: Never Used     Comment: quit 45 years ago  . Alcohol Use: 0.0 oz/week    0 Standard drinks or equivalent per week     Comment: wine during the week, not every day   . Drug Use: No  . Sexual Activity: Not on file   Other Topics Concern  . Not on file   Social History Narrative   Lives with wife [name]   Drinks a cup and a half of coffee in the am      PHYSICAL EXAM  Filed Vitals:   02/16/15 0931  BP: 143/91  Pulse: 90  Height: '5\' 6"'$  (1.676 m)  Weight: 159 lb 12.8 oz (72.485 kg)   Wt Readings from Last 3 Encounters:  02/16/15 159 lb 12.8 oz (72.485 kg)  01/04/15 156 lb 6.4 oz (70.943 kg)  12/08/14 168 lb (76.204 kg)   Body mass index is 25.8 kg/(m^2).   GENERAL EXAM: Patient is in no distress; well developed, nourished and groomed; neck is supple  CARDIOVASCULAR: Regular rate and rhythm, no murmurs, no carotid bruits  NEUROLOGIC: MENTAL STATUS: awake, alert, language fluent, comprehension intact, naming intact, fund of knowledge appropriate CRANIAL NERVE: pupils equal and reactive to light, visual fields full to confrontation, extraocular muscles intact, no nystagmus, facial sensation and strength symmetric, hearing intact, palate elevates symmetrically, uvula midline, shoulder shrug symmetric, tongue midline. NASAL / SLURRED SPEECH; NO DOUBLE VISION AFTER 1 MINUTE UPGAZE.  MOTOR: DECR BULK, DIFFUSE FASCICULATIONS IN SHOULDERS, TRICEPS, FOREARMS, THIGHS AND CALVES; BUE (DELTOIDS 5, BICEPS 5, TRICEPS 5, FINGER ABDUCTION 3, GRIP 4;  ATROPHY OF RIGHT HAND INTRINSIC AND THENAR MUSCLES); BLE (HF 4, KE/KF 5, RIGHT DF 3, LEFT DF 4) SENSORY: VIB < 5 SEC AT TOES; DECR LT AND PP IN FEET COORDINATION: finger-nose-finger, fine finger movements normal REFLEXES: BUE 1, KNEES 3, POSITIVE SUPRA-PATELLAR REFLEXES; ANKLES 0 GAIT/STATION: narrow based gait; DIFF WTH HEEL GAIT (ESP RIGHT FOOT)    DIAGNOSTIC DATA (LABS, IMAGING, TESTING) - I reviewed patient records, labs, notes, testing and imaging myself where available.  Lab Results  Component Value Date   WBC 8.0 11/30/2014   HGB 17.1 11/30/2014  HCT 48.6 11/30/2014   MCV 92 11/30/2014   PLT 208 11/30/2014      Component Value Date/Time   NA 144 11/30/2014 1038   NA 137 11/19/2014 1300   K 4.7 11/30/2014 1038   K 4.4 11/19/2014 1300   CL 100 11/30/2014 1038   CL 102 11/19/2014 1300   CO2 26 11/30/2014 1038   CO2 29 11/19/2014 1300   GLUCOSE 88 11/30/2014 1038   GLUCOSE 104* 11/19/2014 1300   BUN 14 11/30/2014 1038   BUN 11 11/19/2014 1300   CREATININE 0.8 11/30/2014 1038   CREATININE 0.94 11/19/2014 1300   CALCIUM 9.4 11/30/2014 1038   CALCIUM 8.9 11/19/2014 1300   PROT 6.2 01/04/2015 1240   PROT 6.7 11/30/2014 1038   PROT 6.6 11/19/2014 1300   ALBUMIN 4.0 11/19/2014 1300   AST 34 11/30/2014 1038   AST 30 11/19/2014 1300   ALT 36 11/30/2014 1038   ALT 30 11/19/2014 1300   ALKPHOS 74 11/30/2014 1038   ALKPHOS 77 11/19/2014 1300   BILITOT 0.90 11/30/2014 1038   BILITOT 0.9 11/19/2014 1300   GFRNONAA 75* 11/19/2014 1300   GFRAA 87* 11/19/2014 1300   No results found for: CHOL, HDL, LDLCALC, LDLDIRECT, TRIG, CHOLHDL No results found for: HGBA1C Lab Results  Component Value Date   VITAMINB12 1251* 01/04/2015   Lab Results  Component Value Date   TSH 1.140 01/04/2015    11/30/14 CT CHEST - 1. No evidence lung cancers recurrence. Stable postoperative change in the left hemi thorax. 2. Stable small right pulmonary nodules.  11/28/14 MRI BRAIN - No acute  or reversible finding. Mild age related atrophy and chronic small vessel change. [I reviewed images myself and agree with interpretation. -VRP]   12/17/14 EMG/NCS Abnormal study demonstrating: 1. 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 2. Markedly abnormal needle EMG with widespread fasciculations, fibrillation potentials, positive sharp waves and chronic denervation changes in cervical, thoracic, lumbar, bulbar regions. 3. Given the clinical context, considerations include immune neuropathies (CIDP, anti-sulfatide, anti-MAG, anti-GM1, monoclonal gammopathy), hereditary neuropathies (CMT, HMSN), infectious neuropathies (HIV) or metabolic causes (diabetes, hypothyroidism, liver disease). Another consideration would include disorders of motor nerves and/or their axons in combination with underlying sensory neuropathy.  12/02/14 AchR ab < 0.03, anti-VGCC neg, CK 113  12/24/14 CSF - WBC 2, RBC 3, protein 77, glucose 68, gram stain negative    ASSESSMENT AND PLAN  81 y.o. year old male here with progressive slurred speech, weakness in right hand, fatigue, swallowing difficulty, double vision, exercise intolerance. History and exam are concerning for neuromuscular junction disorder such as CIDP based on EMG and LP. However significant bulbar weakness and now hyperreflexia in the lower extremities raises possibility of motor neuron disease (ALS).  PLAN: I spent 40 minutes of face to face time with patient. Greater than 50% of time was spent in counseling and coordination of care with patient. In summary we discussed:  - check MRI cervical and thoracic spine - continue IVIG therapy - refer to pulmonary clinic for eval of shortness of breath and treatment options with non-invasive breathing device - second opinion at ALS/neuromuscular clinic at [facility] neurology  Orders Placed This Encounter    Procedures  . MR Cervical Spine W Wo Contrast  . MR Thoracic Spine W Wo Contrast  . Ambulatory referral to Pulmonology  . Ambulatory referral to Neurology   Return in about 1 month (around 03/18/2015).  Penni Bombard, MD 2/33/0076, 22:63 AM Certified in Neurology, Neurophysiology and Neuroimaging  [facility] [address] [place], [place] [address] [phone]

## 2015-02-22 ENCOUNTER — Other Ambulatory Visit: Payer: Self-pay

## 2015-02-23 ENCOUNTER — Ambulatory Visit (INDEPENDENT_AMBULATORY_CARE_PROVIDER_SITE_OTHER)
Admission: RE | Admit: 2015-02-23 | Discharge: 2015-02-23 | Disposition: A | Discharge status: Home / Self Care | Payer: Medicare Other | Source: Ambulatory Visit | Attending: Internal Medicine | Admitting: Internal Medicine

## 2015-02-23 ENCOUNTER — Encounter: Payer: Self-pay | Admitting: Internal Medicine

## 2015-02-23 ENCOUNTER — Ambulatory Visit (INDEPENDENT_AMBULATORY_CARE_PROVIDER_SITE_OTHER): Payer: Medicare Other | Admitting: Internal Medicine

## 2015-02-23 DIAGNOSIS — R0600 Dyspnea, unspecified: Secondary | ICD-10-CM | POA: Diagnosis not present

## 2015-02-23 MED ORDER — VALSARTAN 160 MG PO TABS: 160.0000 mg | ORAL_TABLET | Freq: Every day | ORAL | Status: AC

## 2015-02-23 MED ORDER — PANTOPRAZOLE SODIUM 40 MG PO TBEC: 40.0000 mg | DELAYED_RELEASE_TABLET | Freq: Every day | ORAL | Status: AC

## 2015-02-23 MED ORDER — FAMOTIDINE 20 MG PO TABS: ORAL_TABLET | ORAL | Status: DC

## 2015-02-23 NOTE — Progress Notes (Signed)
Subjective:    Patient ID: Adam [name], male    DOB: 10-Sep-1928     MRN: [id number]    Brief patient profile:  53 yowm quit smoking 1975 started with cough Feb 2015 > dx with [place] followed by [place]/ [facility] but no chemo/RT to date and pfts s sign obst 10/27/14    Admit date: 11/24/2013  Discharge date: 11/29/2013  Admission Diagnoses:  1. Left upper lobe mass  2. History of tobacco abuse  3. History of asbestos exposure  Discharge Diagnoses:  1. 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 History of tobacco abuse  3. History of asbestos exposure  Procedure (s):  Left video-assisted thoracoscopy, thoracoscopic left upper lobectomy, mediastinal lymph node dissection, resection of mediastinal cyst, On-Q local anesthetic catheter placement by Dr. Roxan Hockey on 11/24/2013.    History of Present Illness  04/29/2014 1st [facility] office visit/ Alaysiah Browder  Chief Complaint  Patient presents with  . Pulmonary Consult    Referred per [facility]. Pt c/o dyspnea x 6 months- with or without any exertion. He wakes up sometimes "breathing fast" and gets SOB after walking approx 2 minutes.   onset of sob late spring /early summer 2015 gradual onset somewhat progressive followed by [facility].  Walking slt incline one eighth 3-4 x weekly sob but doesn't stop No problem supine Has not tried inhalers rec  No change rx   07/21/2014 Follow up COPD and Lung cancer (Stage IIA Adenocarcinoma of LUL ) s/p Resection Returns for persistent symptoms of dyspnea . Occasional cough . No fever or discolored mucus .  Complains of frequent heartburn .  PFTs showed moderate COPD 04/214 Feels his breathing is getting worse over last few months .  Got better after lung resection in March but noticed not able to walk as much for last 2-3 months. Gets winded easily.  No desats with walk test in office last ov.  Walk test in office today with not desats.  Most  recent CT chest showed no significant change w/ no evidence of recurrence or mets. Followed by Dr. Marin Olp . Rec Begin Stiolto  2 puffs daily  Begin Prilosec '20mg'$  daily  Chest xray today  Follow up Dr. Melvyn Novas  In 6 weeks and As needed     09/02/2014 f/u ov/Lajoy [place]urg re: chronic doe/ on stiolto can't tell it's helping  Chief Complaint  Patient presents with  . Follow-up    Pt states that his breathing is unchanged. No new co's today.    4 months prior to OV  Best days able to a mile without  [place] getting exhausted but now takes 20-30 min  does it 3-4 times a week Sneezed about the time the breathing got worse and now pain with twist or bending over only, L anterior rec Try off stiolto for about a month to see what difference if any it makes in  Outdoor walking     10/27/2014 f/u ov/Azjah Pardo re: chronic sob, no change on/off stiolto or prilosec  Chief Complaint  Patient presents with  . Follow-up    PFT done today. Pt states that his breathing is unchanged. He states that he has been having CP on left side- happens when he moves a certain way, coughs or sneezes.   cp present x months, resolves lying flat, no pleuritic component and not worse with ex rec No copd/ no pulmonary  f/u  02/23/2015 f/u ov/Gilmar Bua re:  Chief Complaint  Patient presents with  . Follow-up    Pt referred back to [place] per the request of [place]. Pt c/o increased SOB for the past month. He states he sometimes wakes up feeling SOB, but more so with exertion. He has also having diff with swallowing.   fatigue >> doe at this point but he does occ wake up feeling choked if lies flat and some cough p meals/ mostly dry  No obvious day to day or daytime variabilty or assoc excess mucus production  or  chest tightness, subjective wheeze overt sinus or hb symptoms. No unusual exp hx or h/o childhood pna/ asthma or knowledge of premature birth.  Sleeping ok without nocturnal  or early am exacerbation  of  respiratory  c/o's or need for noct saba. Also denies any obvious fluctuation of symptoms with weather or environmental changes or other aggravating or alleviating factors except as outlined above   Current Medications, Allergies, Complete Past Medical History, Past Surgical History, Family History, and Social History were reviewed in [employer] record.  ROS  The following are not active complaints unless bolded sore throat, dysphagia, dental problems, itching, sneezing,  nasal congestion or excess/ purulent secretions, ear ache,   fever, chills, sweats, unintended wt loss, pleuritic or exertional cp, hemoptysis,  orthopnea pnd or leg swelling, presyncope, palpitations, heartburn, abdominal pain, anorexia, nausea, vomiting, diarrhea  or change in bowel or urinary habits, change in stools or urine, dysuria,hematuria,  rash, arthralgias, visual complaints, headache, numbness weakness or ataxia or problems with walking or coordination,  change in mood/affect or memory.               Objective:   Physical Exam   amb wm nad very prominent pseudowheeze better with purse lip   02/23/2015        157  Wt Readings from Last 3 Encounters:  10/27/14 176 lb 12.8 oz (80.196 kg)  09/02/14 178 lb (80.74 kg)  07/21/14 182 lb 3.2 oz (82.645 kg)    Vital signs reviewed    HEENT: nl dentition, turbinates, and orophanx. Nl external ear canals without cough reflex   NECK :  without JVD/Nodes/TM/ nl carotid upstrokes bilaterally   LUNGS: no acc muscle use, clear to A and P bilaterally without cough on insp or exp maneuvers   CV:  RRR  no s3 or murmur or increase in P2, no edema   ABD:  soft and nontender with nl excursion in the supine position. No bruits or organomegaly, bowel sounds nl  MS:  warm without deformities, calf tenderness, cyanosis or clubbing  SKIN: warm and dry without lesions    NEURO:  alert, approp, speech dysarthric            Labs  reviewed    Lab 10/27/14 1350  NA 138  K 3.9  CL 102  CO2 31  BUN 15  CREATININE 0.87  GLUCOSE 95      Lab Results  Component Value Date   TSH 0.81 10/27/2014     Lab Results  Component Value Date   PROBNP 27.0 10/27/2014       CXR PA and Lateral:   02/23/2015 :     I personally reviewed images and agree with radiology impression as follows:   Cardiac shadow is within normal limits. Postsurgical changes are again noted on the left. No focal infiltrate or sizable effusion is seen. Elevation of left hemidiaphragm is again seen  posteriorly      Assessment & Plan:

## 2015-02-23 NOTE — Patient Instructions (Addendum)
Stop losartan and replace with valsartan 160 mg daily   Pantoprazole (protonix) 40 mg   Take  30-60 min before first meal of the day and Pepcid (famotidine)  20 mg one @  bedtime until return to office - this is the best way to tell whether stomach acid is contributing to your problem.    GERD (REFLUX)  is an extremely common cause of respiratory symptoms just like yours , many times with no obvious heartburn at all.    It can be treated with medication, but also with lifestyle changes including elevation of the head of your bed (ideally with 6 inch  bed blocks),  Smoking cessation, avoidance of late meals, excessive alcohol, and avoid fatty foods, chocolate, peppermint, colas, red wine, and acidic juices such as orange juice.  NO MINT OR MENTHOL PRODUCTS SO NO COUGH DROPS  USE SUGARLESS CANDY INSTEAD (Jolley ranchers or Stover's or Life Savers) or even ice chips will also do - the key is to swallow to prevent all throat clearing. NO OIL BASED VITAMINS - use powdered substitutes.  Please see patient coordinator before you leave today  to schedule  PFT's with MIF and MEF   Please remember to go to the x-ray department downstairs for your tests - we will call you with the results when they are available.

## 2015-02-24 NOTE — Progress Notes (Signed)
Quick Note:  Spoke with pt and notified of results per Dr. Wert. Pt verbalized understanding and denied any questions.  ______ 

## 2015-02-26 ENCOUNTER — Ambulatory Visit (HOSPITAL_COMMUNITY)
Admission: RE | Admit: 2015-02-26 | Discharge: 2015-02-26 | Disposition: A | Discharge status: Home / Self Care | Payer: Medicare Other | Source: Ambulatory Visit | Attending: Internal Medicine | Admitting: Internal Medicine

## 2015-02-26 DIAGNOSIS — R0600 Dyspnea, unspecified: Secondary | ICD-10-CM | POA: Insufficient documentation

## 2015-02-26 LAB — PULMONARY FUNCTION TEST
DL/VA % pred: 40 %
DL/VA: 1.76 ml/min/mmHg/L
DLCO unc % pred: 8 %
DLCO unc: 2.53 ml/min/mmHg
FEF 25-75 Post: 0.63 L/sec
FEF 25-75 Pre: 0.99 L/sec
FEF2575-%Change-Post: -36 %
FEF2575-%PRED-PRE: 65 %
FEF2575-%Pred-Post: 41 %
FEV1-%Change-Post: -11 %
FEV1-%Pred-Post: 52 %
FEV1-%Pred-Pre: 59 %
FEV1-Post: 1.24 L
FEV1-Pre: 1.39 L
FEV1FVC-%Change-Post: -9 %
FEV1FVC-%PRED-PRE: 102 %
FEV6-%CHANGE-POST: -13 %
FEV6-%PRED-POST: 52 %
FEV6-%Pred-Pre: 61 %
FEV6-POST: 1.64 L
FEV6-PRE: 1.9 L
FEV6FVC-%CHANGE-POST: 0 %
FEV6FVC-%PRED-POST: 108 %
FEV6FVC-%Pred-Pre: 108 %
FVC-%Change-Post: -1 %
FVC-%Pred-Post: 55 %
FVC-%Pred-Pre: 56 %
FVC-POST: 1.87 L
FVC-Pre: 1.9 L
POST FEV1/FVC RATIO: 66 %
PRE FEV1/FVC RATIO: 73 %
Post FEV6/FVC ratio: 100 %
Pre FEV6/FVC Ratio: 100 %
RV % PRED: 116 %
RV: 2.99 L
TLC % pred: 69 %
TLC: 4.51 L

## 2015-02-26 MED ORDER — ALBUTEROL SULFATE (2.5 MG/3ML) 0.083% IN NEBU
2.5000 mg | INHALATION_SOLUTION | Freq: Once | RESPIRATORY_TRACT | Status: AC
  Administered 2015-02-26: 2.5 mg via RESPIRATORY_TRACT

## 2015-02-27 ENCOUNTER — Ambulatory Visit
Admission: RE | Admit: 2015-02-27 | Discharge: 2015-02-27 | Disposition: A | Discharge status: Home / Self Care | Payer: Medicare Other | Source: Ambulatory Visit | Attending: Diagnostic Neuroimaging | Admitting: Diagnostic Neuroimaging

## 2015-02-27 DIAGNOSIS — R292 Abnormal reflex: Secondary | ICD-10-CM

## 2015-02-27 DIAGNOSIS — R531 Weakness: Secondary | ICD-10-CM

## 2015-02-27 MED ORDER — GADOBENATE DIMEGLUMINE 529 MG/ML IV SOLN
15.0000 mL | Freq: Once | INTRAVENOUS | Status: AC | PRN
  Administered 2015-02-27: 15 mL via INTRAVENOUS

## 2015-02-28 ENCOUNTER — Encounter: Payer: Self-pay | Admitting: Internal Medicine

## 2015-02-28 NOTE — Assessment & Plan Note (Signed)
-   04/29/2014  Walked RA x 3 laps @ 185 ft each stopped due to end of study, fast pace, no desat, mild sob half way through but did not worsen.  - spirometry 04/29/2014  FEV!  1.82 (64%) ratio 69  - did not improve on stiolto> d/c 09/02/14  - did not improve on ppi > pt d/c 09/2014  - PFTs 10/27/2014 FEV1  1.92 (81%) ratio 79 no change after saba  And dlco 64 corrects to 127 for alv vol - 10/27/2014  Walked RA x 3 laps @ 185 ft each stopped due to end of study,mild chest pressure, rapid pace - 02/25/2015  Walked RA  2 laps @ 185 ft each stopped due to leg weakness/ slow pace no sob or desat  - PFTs with MEP/MIP requested   I had an extended final summary discussion with the patient and his wife reviewing all relevant studies completed to date and  lasting 15 to 20 minutes of a 25 minute visit on the following issues:    Unfortunately it does appear that his problems are neurologic and progressive with with no specific dx but suggestive of an als pattern and I understand he is going to be evaluated for this next  Will obtain pfts with MEP/MIP for baseline  Main immediate concern is that he be careful swallowing/ low threshold for serial speech therapy evals per neuro's discretion.  Each maintenance medication was reviewed in detail including most importantly the difference between maintenance and as needed and under what circumstances the prns are to be used.  Please see instructions for details which were reviewed in writing and the patient given a copy.

## 2015-03-02 NOTE — Progress Notes (Signed)
Quick Note:  ATC, line busy, WCB ______

## 2015-03-03 ENCOUNTER — Telehealth: Payer: Self-pay | Admitting: Internal Medicine

## 2015-03-03 NOTE — Progress Notes (Signed)
Quick Note:  LMTCB ______ 

## 2015-03-03 NOTE — Telephone Encounter (Signed)
Result Notes     Notes Recorded by Rosana Berger, CMA on 02/24/2015 at 9:50 AM Spoke with pt and notified of results per Dr. Melvyn Novas. Pt verbalized understanding and denied any questions.  ------  Notes Recorded by Tanda Rockers, MD on 02/23/2015 at 7:56 PM Call pt: Reviewed cxr and no acute change so no change in recommendations made at [facility]   Advised pt of his results again. Nothing further was needed.

## 2015-03-03 NOTE — Telephone Encounter (Signed)
Pt cb

## 2015-03-04 ENCOUNTER — Telehealth: Payer: Self-pay | Admitting: Internal Medicine

## 2015-03-04 NOTE — Telephone Encounter (Signed)
Notes Recorded by Inge Rise, [place] on 03/04/2015 at 10:55 AM lmomtcb x2 for pt Notes Recorded by Rosana Berger, CMA on 03/03/2015 at 10:16 AM LMTCB Notes Recorded by Rosana Berger, CMA on 03/02/2015 at 1:52 PM ATC, line busy, WCB Notes Recorded by Tanda Rockers, MD on 03/02/2015 at 10:47 AM Call patient : Study is c/w muscles weakness as the main problem, send to his neurologist also, no change in recs Notes Recorded by Tanda Rockers, MD on 02/26/2015 at 5:23 PM Discussed with patient at [facility] -------- Spoke with pt's wife. She is aware of these results. Nothing further was needed.

## 2015-03-24 ENCOUNTER — Ambulatory Visit: Payer: Medicare Other | Admitting: Diagnostic Neuroimaging

## 2015-04-05 IMAGING — CR DG CHEST 2V
2 series · 2 of 2 positions shown · non-contrast
Comparison: CT chest of 02/26/2014 and chest x-ray of 01/27/2014

CLINICAL DATA: Chronic shortness of breath, history of lung
carcinoma

EXAM:
CHEST  2 VIEW

[view not recorded (1 of 2)]
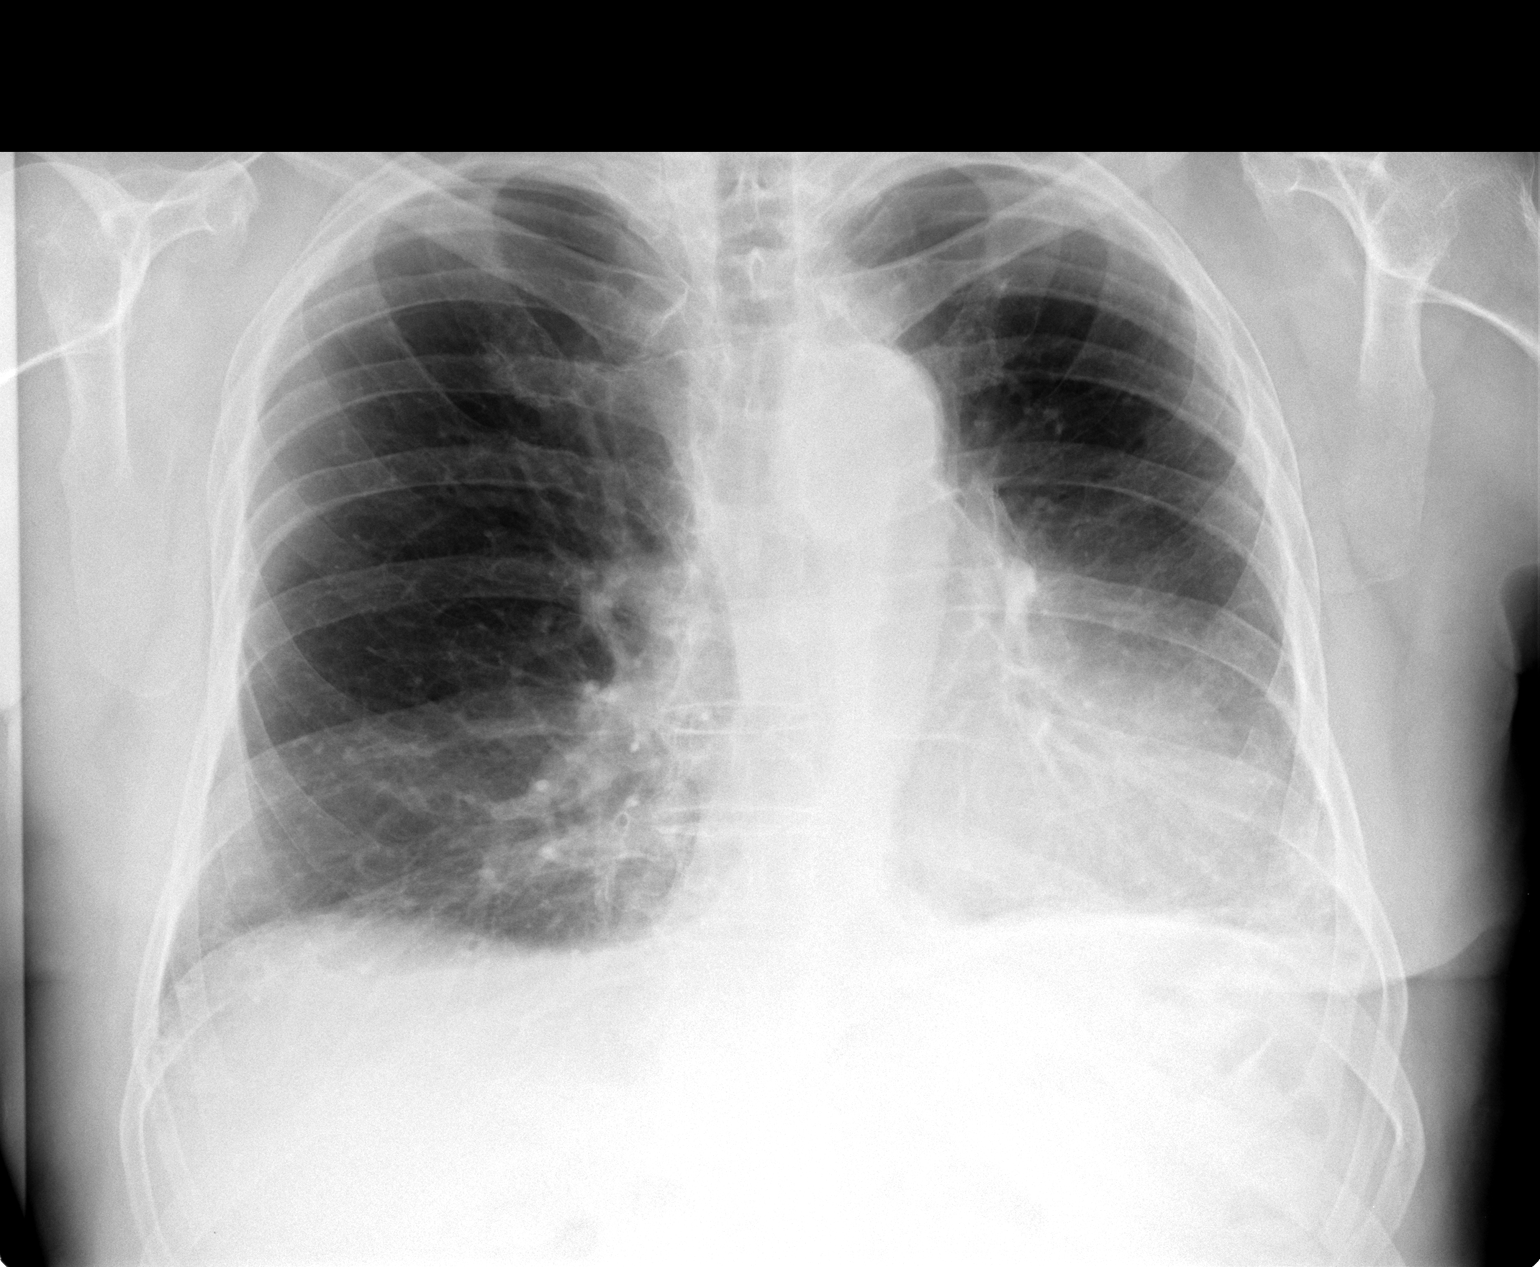

[view not recorded (2 of 2)]
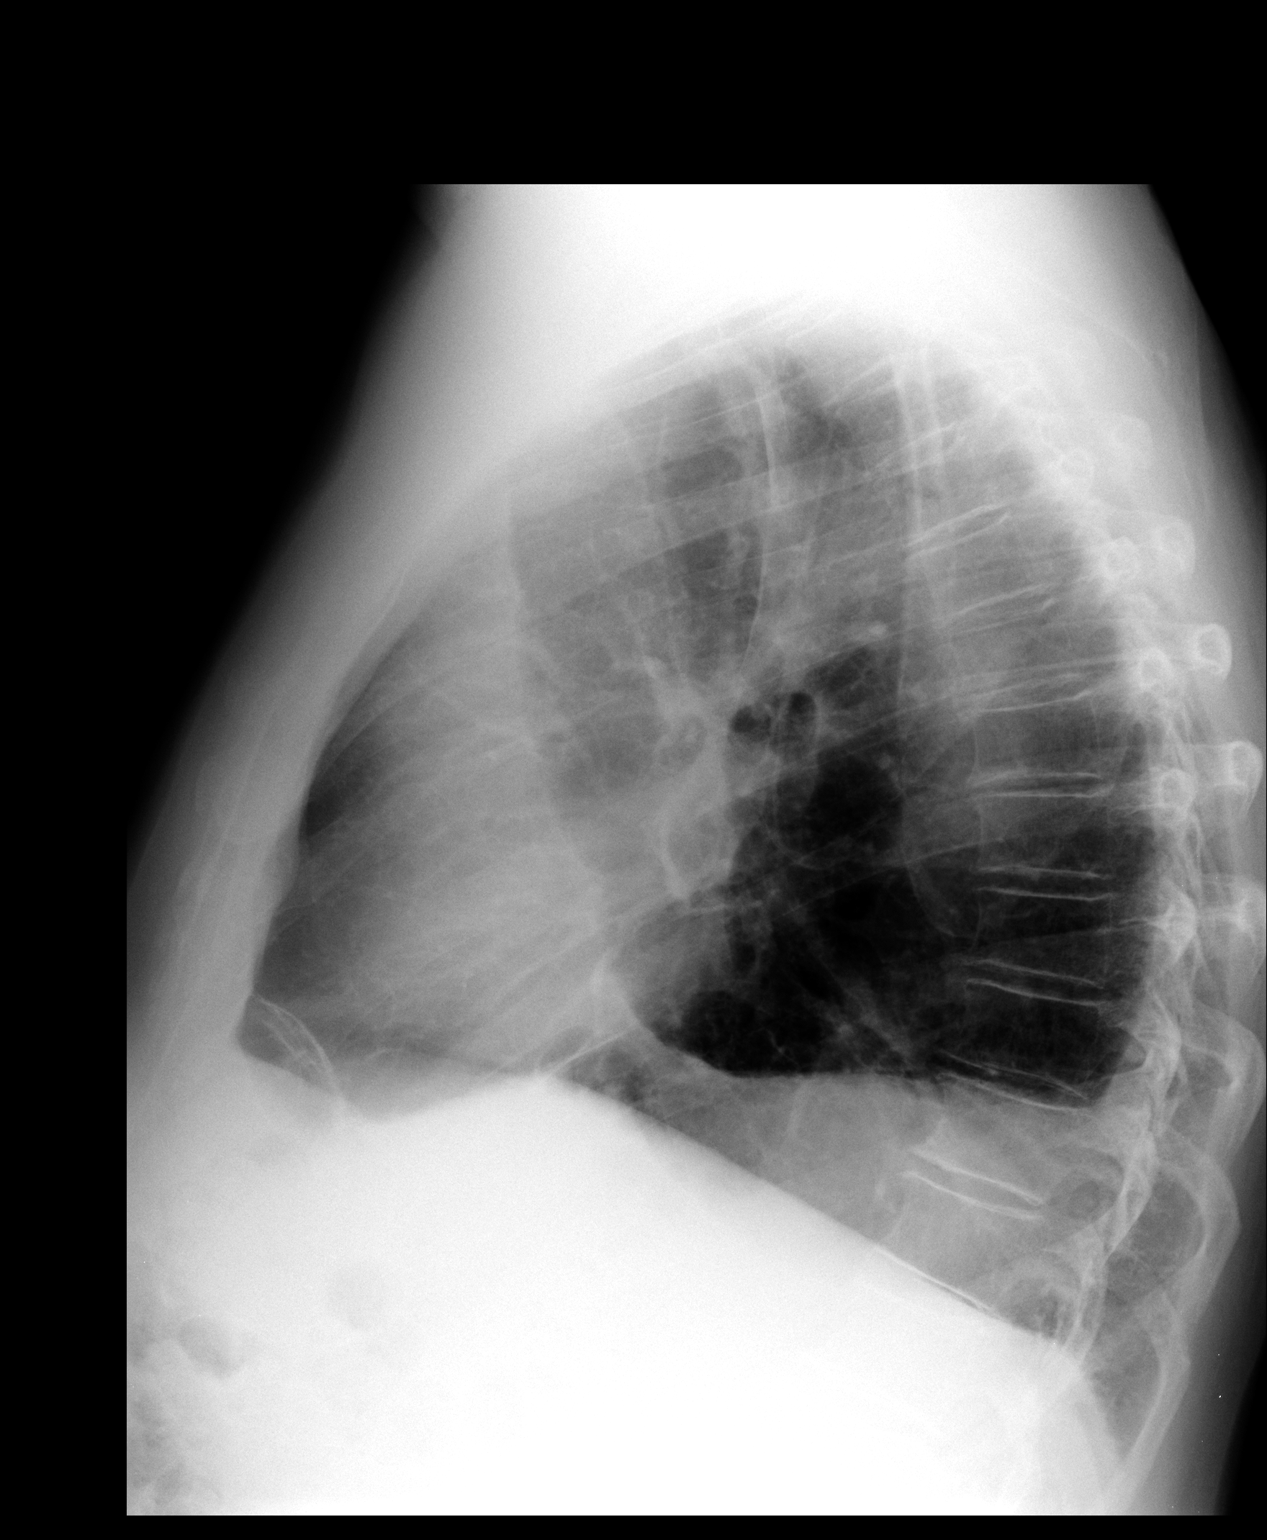

[2 of 2 positions shown; findings below may reference images not displayed]

FINDINGS: Aeration of the left lung base has improved in the interval.
Postoperative changes on the left are stable. The right lung is
clear. Heart size is stable. No bony abnormality is seen.
IMPRESSION: Improved aeration of the left lung base. Stable postoperative change
on the left.

## 2015-04-07 ENCOUNTER — Ambulatory Visit: Payer: Medicare Other | Admitting: Diagnostic Neuroimaging

## 2015-04-26 ENCOUNTER — Other Ambulatory Visit: Payer: Self-pay | Admitting: Internal Medicine

## 2015-05-05 ENCOUNTER — Telehealth: Payer: Self-pay | Admitting: *Deleted

## 2015-05-05 NOTE — Telephone Encounter (Signed)
Adam [name] contacting office to notify of new diagnosis of ALS. Patient is now on Hospice services. They no longer wish to have scans or follow up appointments with Dr Marin Olp. All scheduled appointments cancelled. Dr Marin Olp aware.

## 2015-05-31 ENCOUNTER — Ambulatory Visit: Payer: Medicare Other | Admitting: Hematology & Oncology

## 2015-05-31 ENCOUNTER — Other Ambulatory Visit (HOSPITAL_BASED_OUTPATIENT_CLINIC_OR_DEPARTMENT_OTHER): Payer: Medicare Other

## 2015-05-31 ENCOUNTER — Other Ambulatory Visit: Payer: Medicare Other

## 2015-05-31 ENCOUNTER — Ambulatory Visit (HOSPITAL_BASED_OUTPATIENT_CLINIC_OR_DEPARTMENT_OTHER): Payer: Medicare Other

## 2015-07-29 DEATH — deceased

## 2015-11-30 IMAGING — XA DG FLUORO GUIDE LUMBAR PUNCTURE
1 series · 5 of 5 positions shown · non-contrast
Comparison: none

CLINICAL DATA: Weakness.  Unsteady gait.  Abnormal speech.  CIDP.

[Series 1: ortho standard · 3 acquisitions, 5 frames shown]
[im 1/3]
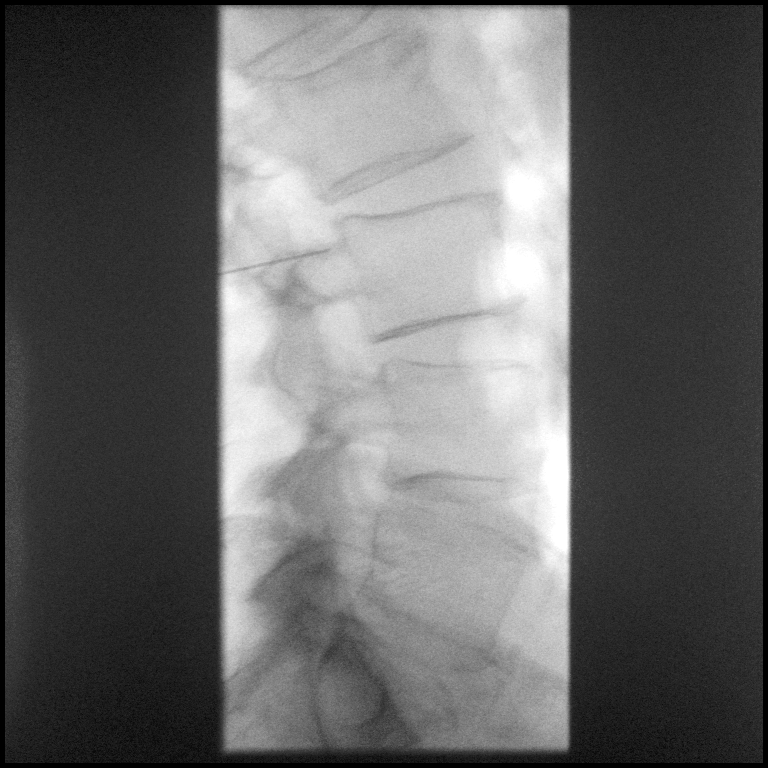
[im 2/3]
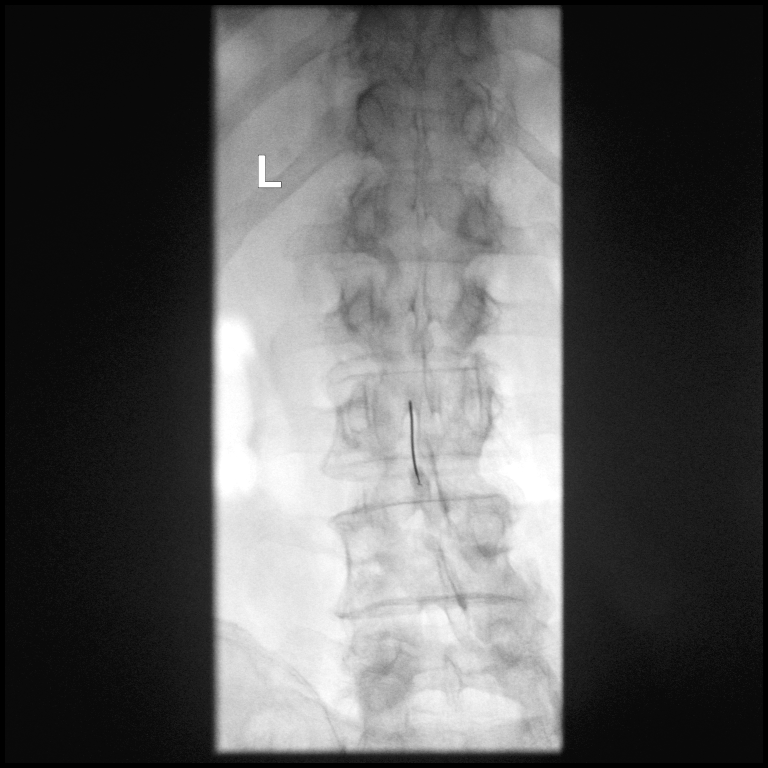
[im 3/3]
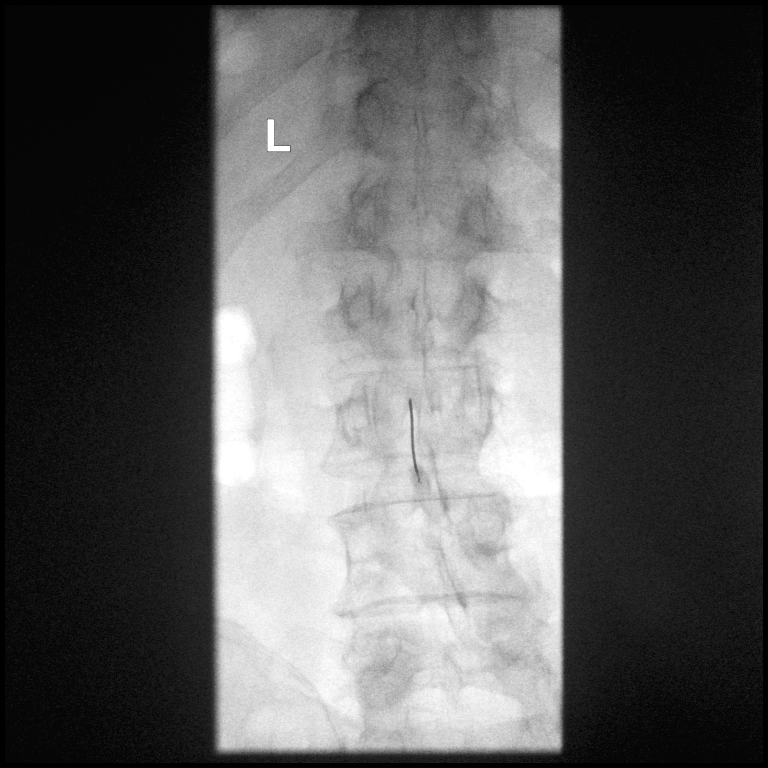
[im 3/3]
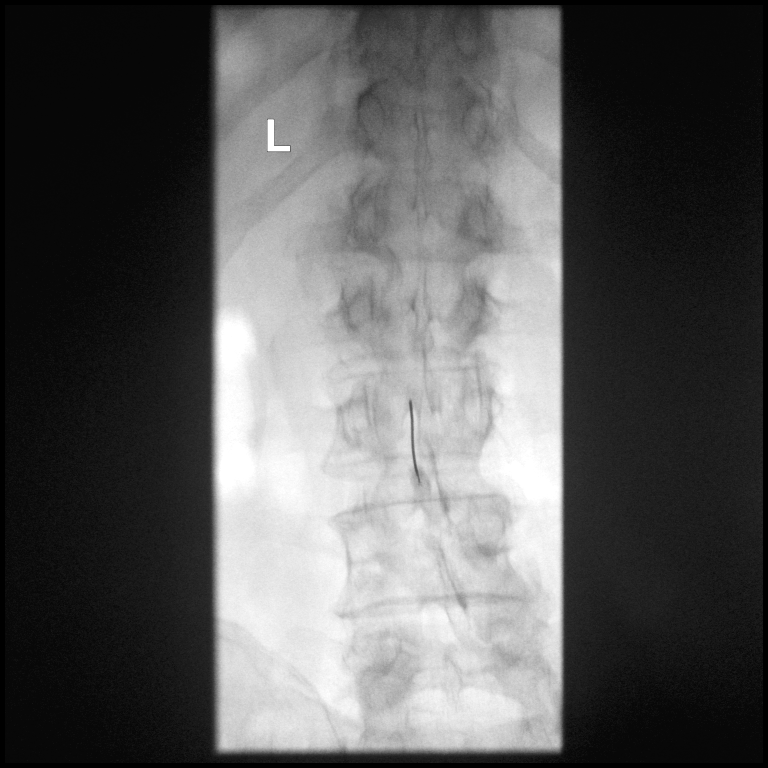
[im 3/3]
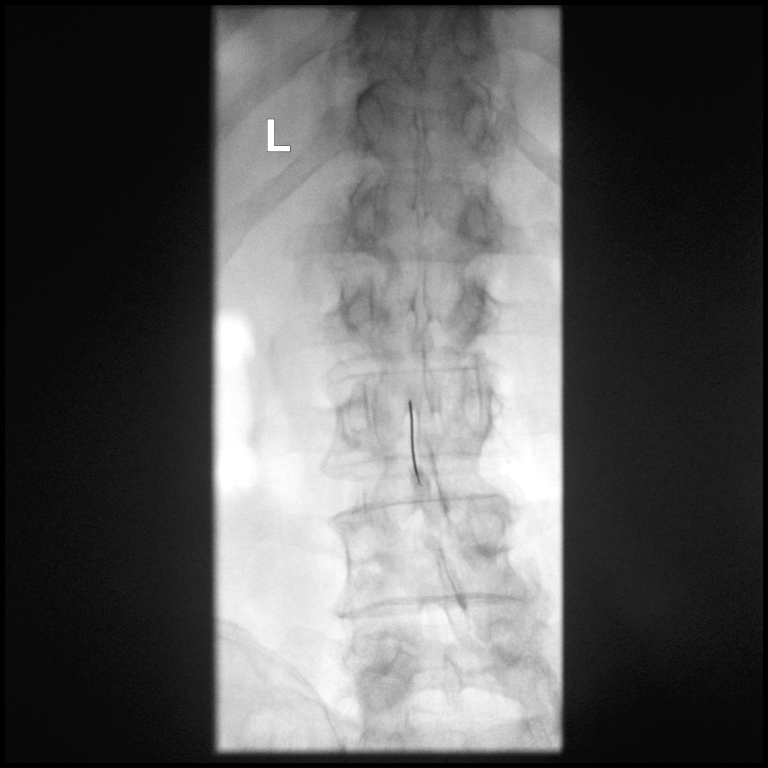

[5 of 5 positions shown; findings below may reference images not displayed]

EXAM:
DIAGNOSTIC LUMBAR PUNCTURE UNDER FLUOROSCOPIC GUIDANCE

FLUOROSCOPY TIME:  Radiation Exposure Index (as provided by the
fluoroscopic device): 1002.18 uGy*m2

PROCEDURE:
Informed consent was obtained from the patient prior to the
procedure, including potential complications of headache, allergy,
and pain. With the patient prone, the lower back was prepped with
Betadine. 1% Lidocaine was used for local anesthesia. Lumbar
puncture was performed at the left paramedian L2-3 level using a 20
gauge needle with return of clear CSF with an opening pressure of
11.0 cm water. 9.0 ml of CSF were obtained for laboratory studies.
The patient tolerated the procedure well and there were no apparent
complications.
IMPRESSION: Technically successful fluoroscopic guided lumbar puncture via a
left paramedian approach at the L2-3 level.
